# Patient Record
Sex: Male | Born: 1955
Health system: Southern US, Community
[De-identification: ages and names within clinical notes are randomized; demographics above are authoritative.]

## PROBLEM LIST (undated history)

## (undated) DIAGNOSIS — G5762 Lesion of plantar nerve, left lower limb: Secondary | ICD-10-CM

## (undated) DIAGNOSIS — Z973 Presence of spectacles and contact lenses: Secondary | ICD-10-CM

## (undated) DIAGNOSIS — N200 Calculus of kidney: Secondary | ICD-10-CM

## (undated) DIAGNOSIS — E785 Hyperlipidemia, unspecified: Secondary | ICD-10-CM

## (undated) DIAGNOSIS — I10 Essential (primary) hypertension: Secondary | ICD-10-CM

## (undated) DIAGNOSIS — K4090 Unilateral inguinal hernia, without obstruction or gangrene, not specified as recurrent: Secondary | ICD-10-CM

## (undated) DIAGNOSIS — G4730 Sleep apnea, unspecified: Secondary | ICD-10-CM

## (undated) DIAGNOSIS — K5790 Diverticulosis of intestine, part unspecified, without perforation or abscess without bleeding: Secondary | ICD-10-CM

## (undated) DIAGNOSIS — M109 Gout, unspecified: Secondary | ICD-10-CM

## (undated) HISTORY — DX: Hyperlipidemia, unspecified: E78.5

## (undated) HISTORY — PX: OTHER SURGICAL HISTORY: SHX169

## (undated) HISTORY — PX: LITHOTRIPSY: SUR834

## (undated) HISTORY — PX: VASECTOMY: SHX75

## (undated) HISTORY — PX: TONSILLECTOMY: SUR1361

## (undated) HISTORY — PX: COLONOSCOPY: SHX174

---

## 1997-09-02 ENCOUNTER — Ambulatory Visit (HOSPITAL_COMMUNITY): Admission: RE | Admit: 1997-09-02 | Discharge: 1997-09-02 | Payer: Self-pay | Admitting: Urology

## 2000-07-31 ENCOUNTER — Ambulatory Visit (HOSPITAL_COMMUNITY): Admission: RE | Admit: 2000-07-31 | Discharge: 2000-07-31 | Payer: Self-pay | Admitting: Urology

## 2000-07-31 ENCOUNTER — Encounter: Payer: Self-pay | Admitting: Urology

## 2001-10-01 ENCOUNTER — Encounter: Payer: Self-pay | Admitting: Orthopedic Surgery

## 2001-10-01 ENCOUNTER — Ambulatory Visit (HOSPITAL_COMMUNITY): Admission: RE | Admit: 2001-10-01 | Discharge: 2001-10-01 | Payer: Self-pay | Admitting: Orthopedic Surgery

## 2005-09-13 ENCOUNTER — Encounter: Admission: RE | Admit: 2005-09-13 | Discharge: 2005-09-13 | Payer: Self-pay | Admitting: Cardiology

## 2009-02-03 ENCOUNTER — Ambulatory Visit (HOSPITAL_COMMUNITY): Admission: RE | Admit: 2009-02-03 | Discharge: 2009-02-03 | Payer: Self-pay | Admitting: Family Medicine

## 2009-03-16 ENCOUNTER — Ambulatory Visit (HOSPITAL_COMMUNITY): Admission: RE | Admit: 2009-03-16 | Discharge: 2009-03-16 | Payer: Self-pay | Admitting: Urology

## 2010-06-25 ENCOUNTER — Emergency Department (HOSPITAL_COMMUNITY)
Admission: EM | Admit: 2010-06-25 | Discharge: 2010-06-25 | Disposition: A | Discharge status: Other Acute Inpatient Hospital | Payer: 59 | Source: Home / Self Care | Attending: Emergency Medicine | Admitting: Emergency Medicine

## 2010-06-25 ENCOUNTER — Emergency Department (HOSPITAL_COMMUNITY): Payer: 59

## 2010-06-25 DIAGNOSIS — I10 Essential (primary) hypertension: Secondary | ICD-10-CM | POA: Diagnosis present

## 2010-06-25 DIAGNOSIS — L989 Disorder of the skin and subcutaneous tissue, unspecified: Secondary | ICD-10-CM | POA: Insufficient documentation

## 2010-06-25 DIAGNOSIS — N12 Tubulo-interstitial nephritis, not specified as acute or chronic: Secondary | ICD-10-CM | POA: Insufficient documentation

## 2010-06-25 DIAGNOSIS — R109 Unspecified abdominal pain: Secondary | ICD-10-CM | POA: Insufficient documentation

## 2010-06-25 DIAGNOSIS — N2889 Other specified disorders of kidney and ureter: Secondary | ICD-10-CM | POA: Diagnosis present

## 2010-06-25 DIAGNOSIS — E7800 Pure hypercholesterolemia, unspecified: Secondary | ICD-10-CM | POA: Diagnosis present

## 2010-06-25 DIAGNOSIS — N201 Calculus of ureter: Secondary | ICD-10-CM | POA: Insufficient documentation

## 2010-06-25 DIAGNOSIS — R509 Fever, unspecified: Secondary | ICD-10-CM | POA: Insufficient documentation

## 2010-06-25 DIAGNOSIS — N1330 Unspecified hydronephrosis: Secondary | ICD-10-CM | POA: Insufficient documentation

## 2010-06-25 LAB — PSA: PSA: 0.26 ng/mL (ref <=4.00)

## 2010-06-25 LAB — COMPREHENSIVE METABOLIC PANEL
ALT: 25 U/L (ref 0–53)
BUN: 30 mg/dL — ABNORMAL HIGH (ref 6–23)
CO2: 24 mEq/L (ref 19–32)
Calcium: 8.8 mg/dL (ref 8.4–10.5)
GFR calc non Af Amer: 45 mL/min — ABNORMAL LOW (ref >60)
Glucose, Bld: 113 mg/dL — ABNORMAL HIGH (ref 70–99)
Total Protein: 7 g/dL (ref 6.0–8.3)

## 2010-06-25 LAB — URINE MICROSCOPIC-ADD ON

## 2010-06-25 LAB — LACTIC ACID, PLASMA: Lactic Acid, Venous: 1.8 mmol/L (ref 0.5–2.2)

## 2010-06-25 LAB — URINALYSIS, ROUTINE W REFLEX MICROSCOPIC
Bilirubin Urine: NEGATIVE
Glucose, UA: NEGATIVE mg/dL
Ketones, ur: 40 mg/dL — AB
Nitrite: POSITIVE — AB
Protein, ur: 100 mg/dL — AB
Specific Gravity, Urine: 1.024 (ref 1.005–1.030)
Urobilinogen, UA: 0.2 mg/dL (ref 0.0–1.0)
pH: 5.5 (ref 5.0–8.0)

## 2010-06-25 LAB — CBC
HCT: 40.4 % (ref 39.0–52.0)
Hemoglobin: 14.9 g/dL (ref 13.0–17.0)
MCH: 31.6 pg (ref 26.0–34.0)
MCHC: 36.9 g/dL — ABNORMAL HIGH (ref 30.0–36.0)
MCV: 85.6 fL (ref 78.0–100.0)
RDW: 13 % (ref 11.5–15.5)

## 2010-06-25 LAB — DIFFERENTIAL
Eosinophils Relative: 0 % (ref 0–5)
Lymphocytes Relative: 4 % — ABNORMAL LOW (ref 12–46)
Lymphs Abs: 0.5 10*3/uL — ABNORMAL LOW (ref 0.7–4.0)
Monocytes Absolute: 1.4 10*3/uL — ABNORMAL HIGH (ref 0.1–1.0)
Monocytes Relative: 12 % (ref 3–12)
Neutro Abs: 9.8 10*3/uL — ABNORMAL HIGH (ref 1.7–7.7)

## 2010-06-26 ENCOUNTER — Inpatient Hospital Stay (HOSPITAL_COMMUNITY)
Admission: AD | Admit: 2010-06-26 | Discharge: 2010-06-28 | DRG: 694 | Disposition: A | Discharge status: Home / Self Care | Payer: 59 | Source: Other Acute Inpatient Hospital | Attending: Urology | Admitting: Urology

## 2010-06-26 LAB — BASIC METABOLIC PANEL
BUN: 30 mg/dL — ABNORMAL HIGH (ref 6–23)
Chloride: 105 mEq/L (ref 96–112)
Creatinine, Ser: 1.56 mg/dL — ABNORMAL HIGH (ref 0.4–1.5)
GFR calc Af Amer: 57 mL/min — ABNORMAL LOW (ref >60)
GFR calc non Af Amer: 47 mL/min — ABNORMAL LOW (ref >60)
Potassium: 3.3 mEq/L — ABNORMAL LOW (ref 3.5–5.1)

## 2010-06-26 LAB — CARDIAC PANEL(CRET KIN+CKTOT+MB+TROPI)
CK, MB: 1 ng/mL (ref 0.3–4.0)
Relative Index: INVALID (ref 0.0–2.5)
Total CK: 62 U/L (ref 7–232)

## 2010-06-26 LAB — MAGNESIUM: Magnesium: 2.3 mg/dL (ref 1.5–2.5)

## 2010-06-26 LAB — DIFFERENTIAL
Basophils Absolute: 0 10*3/uL (ref 0.0–0.1)
Basophils Relative: 0 % (ref 0–1)
Lymphocytes Relative: 12 % (ref 12–46)
Neutro Abs: 6.7 10*3/uL (ref 1.7–7.7)
Neutrophils Relative %: 75 % (ref 43–77)

## 2010-06-26 LAB — CBC
HCT: 37.4 % — ABNORMAL LOW (ref 39.0–52.0)
Hemoglobin: 13.1 g/dL (ref 13.0–17.0)
MCH: 30.7 pg (ref 26.0–34.0)
MCV: 87 fL (ref 78.0–100.0)
Platelets: 161 10*3/uL (ref 150–400)
RBC: 4.6 MIL/uL (ref 4.22–5.81)
RBC: 4.29 MIL/uL (ref 4.22–5.81)
RDW: 13 % (ref 11.5–15.5)
WBC: 9.8 10*3/uL (ref 4.0–10.5)
WBC: 9 10*3/uL (ref 4.0–10.5)

## 2010-06-26 LAB — HEPATIC FUNCTION PANEL
Alkaline Phosphatase: 79 U/L (ref 39–117)
Indirect Bilirubin: 0.3 mg/dL (ref 0.3–0.9)
Total Protein: 5.8 g/dL — ABNORMAL LOW (ref 6.0–8.3)

## 2010-06-26 LAB — CORTISOL: Cortisol, Plasma: 14.4 ug/dL

## 2010-06-26 LAB — PROCALCITONIN: Procalcitonin: 3.31 ng/mL

## 2010-06-26 LAB — PHOSPHORUS: Phosphorus: 1.9 mg/dL — ABNORMAL LOW (ref 2.3–4.6)

## 2010-06-26 LAB — PROTIME-INR: INR: 1.13 (ref 0.00–1.49)

## 2010-06-27 LAB — URINALYSIS, ROUTINE W REFLEX MICROSCOPIC
Bilirubin Urine: NEGATIVE
Glucose, UA: NEGATIVE mg/dL
Ketones, ur: NEGATIVE mg/dL
pH: 6.5 (ref 5.0–8.0)

## 2010-06-27 LAB — BASIC METABOLIC PANEL
BUN: 15 mg/dL (ref 6–23)
Chloride: 106 mEq/L (ref 96–112)
Glucose, Bld: 140 mg/dL — ABNORMAL HIGH (ref 70–99)
Potassium: 3.4 mEq/L — ABNORMAL LOW (ref 3.5–5.1)

## 2010-06-27 LAB — URINE CULTURE
Colony Count: >=100000
Culture  Setup Time: 201204301742

## 2010-06-27 LAB — CBC
HCT: 33.6 % — ABNORMAL LOW (ref 39.0–52.0)
MCV: 86.2 fL (ref 78.0–100.0)
RBC: 3.9 MIL/uL — ABNORMAL LOW (ref 4.22–5.81)
RDW: 13.2 % (ref 11.5–15.5)
WBC: 9 10*3/uL (ref 4.0–10.5)

## 2010-06-27 LAB — URINE MICROSCOPIC-ADD ON

## 2010-06-28 LAB — URINE CULTURE
Colony Count: NO GROWTH
Culture  Setup Time: 201205021629

## 2010-07-01 LAB — CULTURE, BLOOD (ROUTINE X 2)
Culture  Setup Time: 201204302330
Culture: NO GROWTH

## 2010-07-09 NOTE — Op Note (Signed)
  Vincent Mendoza, Vincent Mendoza             ACCOUNT NO.:  [id number]  MEDICAL RECORD NO.:  [id number]           PATIENT TYPE:  I  LOCATION:  1233                         FACILITY:  [facility]  PHYSICIAN:  Martina Sinner, MD DATE OF BIRTH:  12-08-1955  DATE OF PROCEDURE:  06/26/2010 DATE OF DISCHARGE:                              OPERATIVE REPORT   PREOPERATIVE DIAGNOSES:  Left ureteral stone and fever.  POSTOPERATIVE DIAGNOSES:  Left ureteral stone and fever.  SURGERY:  Cystoscopy, left retrograde urethrogram, and insertion of left ureteral stent.  Vincent Mendoza has an infected stone, but unfortunately ate in the emergency room and was scheduled for 6:00 a.m.  The patient was prepped and draped in usual fashion.  A 21 scope was utilized.  Penile bulbar membranous urethra normal.  Prostatic urethra was normal.  Bladder mucosa and trigone were normal.  There is no stitch, foreign body, or carcinoma.  Under fluoroscopic guidance, I passed a sensor wire to the middle of the mid ureter and then over the wire passed a 6-French open-end ureteral catheter.  I did a gentle left retrograde urethrogram.  LEFT RETROGRADE URETHROGRAM:  I did left retrograde urethrogram only using approximately 4 to 5 mL of contrast.  A little bit of contrast reach the mildly dilated left collecting system. This marked the position more weird.  I then passed the wire up to the curling in the upper pole calix.  I removed the open-end ureteral catheter.  Under fluoroscopic and cystoscopic guidance, I then passed a 26 cm x 6-French double-J stent with no string curling in the upper pole calix and also in the bladder. I was very pleased with the position.  No hard copies were taken.  The patient was taken to recovery room.          ______________________________ Martina Sinner, MD     SAM/MEDQ  D:  06/26/2010  T:  06/26/2010  Job:  [id number]  Electronically Signed by Alfredo Martinez MD on 07/09/2010  01:16:18 PM

## 2010-07-16 ENCOUNTER — Ambulatory Visit (HOSPITAL_COMMUNITY)
Admission: RE | Admit: 2010-07-16 | Discharge: 2010-07-16 | Disposition: A | Discharge status: Home / Self Care | Payer: 59 | Source: Ambulatory Visit | Attending: Urology | Admitting: Urology

## 2010-07-16 DIAGNOSIS — N201 Calculus of ureter: Secondary | ICD-10-CM | POA: Insufficient documentation

## 2010-07-16 DIAGNOSIS — N1330 Unspecified hydronephrosis: Secondary | ICD-10-CM | POA: Insufficient documentation

## 2010-08-02 NOTE — Discharge Summary (Signed)
  NAMECALEY, VOLKERT             ACCOUNT NO.:  [id number]  MEDICAL RECORD NO.:  [id number]           PATIENT TYPE:  I  LOCATION:  1337                         FACILITY:  [facility]  PHYSICIAN:  Martina Sinner, MD DATE OF BIRTH:  [birth year]  DATE OF ADMISSION:  06/26/2010 DATE OF DISCHARGE:  06/28/2010                              DISCHARGE SUMMARY   ADMISSION DIAGNOSES: 1. Left ureteral stone. 2. Fever.  DISCHARGE DIAGNOSES: 1. Left ureteral stone. 2. Fever.  PROCEDURES: 1. Cystoscopy. 2. Left retrograde urethrogram. 3. Insertion of left ureteral stent.  HISTORY AND PHYSICAL:  For full details, please see admission history and physical.  Briefly, Mr. Pipkins is a 55 year old gentleman who was found to have a left ureteral stone and fever.  After careful consideration regarding management options for treatment, he elected to proceed with surgical therapy and a cystoscopy with left retrogradepyelography and insertion of left ureteral stent.  HOSPITAL COURSE:  On Jun 26, 2010, he was taken to the operating room where he underwent the above named procedures and which he tolerated well and without complications.  Postoperatively he was able to be transferred to regular hospital room following recovery from anesthesia. He was found to be hemodynamically stable.  He was started on prophylactic antibiotic therapy.  He was able to tolerate a regular diet and his pain was well managed.  On Jun 28, 2010, he was ready for discharge home as his pain was well managed.  He was tolerating clear liquid diet and he was afebrile.  FOLLOWUP:  He will follow up in 1 week on an outpatient basis for electroshock wave lithotripsy for definitive management of his left ureteral calculus.  MEDICATIONS:  He was given a prescription for Vicodin to take as needed for pain and told to use Colace as a stool softener.  He was also given a prescription for Cipro.  DISCHARGED INSTRUCTIONS:  He was  instructed to be ambulatory but specifically told to refrain from any heavy lifting, strenuous activity or driving.  He was instructed to resume a regular diet and to contact the office for any further development of fevers.     Delia Chimes, NP   ______________________________ Martina Sinner, MD    MA/MEDQ  D:  07/19/2010  T:  07/19/2010  Job:  [id number]  Electronically Signed by Delia Chimes NP on 07/30/2010 10:01:05 AM Electronically Signed by Alfredo Martinez MD on 08/02/2010 11:19:16 AM

## 2010-08-14 NOTE — H&P (Signed)
  NAMEFERGUS, THRONE             ACCOUNT NO.:  [id number]  MEDICAL RECORD NO.:  [id number]  LOCATION:  1337                         FACILITY:  [facility]  PHYSICIAN:  Martina Sinner, MD DATE OF BIRTH:  24-Dec-1955  DATE OF ADMISSION:  06/26/2010 DATE OF DISCHARGE:  06/28/2010                             HISTORY & PHYSICAL   DIAGNOSIS:  Obstructing stone with fever.  HISTORY OF PRESENT ILLNESS:  Mr. Willcutt was seen in the Emergency Room with an obstructing stone and fever.  He had eaten, so he was admitted to step-down for observation.  He was then treated appropriately with antibiotics.  He went on to have a stent.  He did very well postprocedure.  He was given IV Zosyn and vancomycin.  It took a few days for his fever to settle down.  His white blood count normalized to 9.8.  His electrolytes were normal. Serum creatinine normalized.  Urine culture was negative.  PAST MEDICAL HISTORY:  Negative for stones. MEDICATIONS:  See orders.  ALLERGIES:  See orders .  SOCIAL HISTORY:  Lives locally.  FAMILY HISTORY:  Negative for stone disease.  REVIEW OF SYSTEMS:  Rest of the review of systems is normal.  PHYSICAL EXAMINATION:  VITAL SIGNS:  Increased heart rate. GENERAL:  Patient looked a little bit flushed. CARDIOVASCULAR:  Skin warm. RESPIRATORY:  Breaths quiet. ABDOMEN:  No abdominal tenderness. GU EXAMINATION:  Positive left CVA tenderness. SKIN:  No rash. NEUROLOGIC:  Normal sensation to touch. MUSCULOSKELETAL:  Normal arm motor. LYMPHATIC:  No inguinal adenopathy.  ASSESSMENT AND PLAN:  Mr. Jaroszewski was admitted and subsequently underwent a stent.  He was discharged home on antibiotics and will be followed up as per protocol.          ______________________________ Martina Sinner, MD     SAM/MEDQ  D:  08/02/2010  T:  08/02/2010  Job:  [id number]  Electronically Signed by Alfredo Martinez MD on 08/14/2010 09:08:33 AM

## 2011-03-20 ENCOUNTER — Ambulatory Visit (AMBULATORY_SURGERY_CENTER): Payer: 59 | Admitting: *Deleted

## 2011-03-20 VITALS — Ht 70.0 in | Wt 189.4 lb

## 2011-03-20 DIAGNOSIS — Z1211 Encounter for screening for malignant neoplasm of colon: Secondary | ICD-10-CM

## 2011-03-20 MED ORDER — MOVIPREP 100 G PO SOLR: ORAL | Status: DC

## 2011-04-03 ENCOUNTER — Encounter: Payer: Self-pay | Admitting: Gastroenterology

## 2011-04-03 ENCOUNTER — Ambulatory Visit (AMBULATORY_SURGERY_CENTER): Payer: 59 | Admitting: Gastroenterology

## 2011-04-03 VITALS — BP 139/78 | HR 64 | Temp 98.2°F | Resp 20 | Ht 70.0 in | Wt 189.0 lb

## 2011-04-03 DIAGNOSIS — D129 Benign neoplasm of anus and anal canal: Secondary | ICD-10-CM

## 2011-04-03 DIAGNOSIS — D126 Benign neoplasm of colon, unspecified: Secondary | ICD-10-CM

## 2011-04-03 DIAGNOSIS — D128 Benign neoplasm of rectum: Secondary | ICD-10-CM

## 2011-04-03 DIAGNOSIS — Z1211 Encounter for screening for malignant neoplasm of colon: Secondary | ICD-10-CM

## 2011-04-03 DIAGNOSIS — K5730 Diverticulosis of large intestine without perforation or abscess without bleeding: Secondary | ICD-10-CM

## 2011-04-03 MED ORDER — SODIUM CHLORIDE 0.9 % IV SOLN: 500.0000 mL | INTRAVENOUS | Status: DC

## 2011-04-03 NOTE — Progress Notes (Signed)
Patient did not experience any of the following events: a burn prior to discharge; a fall within the facility; wrong site/side/patient/procedure/implant event; or a hospital transfer or hospital admission upon discharge from the facility. (G8907) Patient did not have preoperative order for IV antibiotic SSI prophylaxis. (G8918)  

## 2011-04-03 NOTE — Patient Instructions (Signed)
Please refer to your blue and neon green sheets for instructions regarding diet and activity for the rest of today.  You may resume your medications as you would normally take them.   Diverticulosis Diverticulosis is a common condition that develops when small pouches (diverticula) form in the wall of the colon. The risk of diverticulosis increases with age. It happens more often in people who eat a low-fiber diet. Most individuals with diverticulosis have no symptoms. Those individuals with symptoms usually experience abdominal pain, constipation, or loose stools (diarrhea). HOME CARE INSTRUCTIONS   Increase the amount of fiber in your diet as directed by your caregiver or dietician. This may reduce symptoms of diverticulosis.   Your caregiver may recommend taking a dietary fiber supplement.   Drink at least 6 to 8 glasses of water each day to prevent constipation.   Try not to strain when you have a bowel movement.   Your caregiver may recommend avoiding nuts and seeds to prevent complications, although this is still an uncertain benefit.   Only take over-the-counter or prescription medicines for pain, discomfort, or fever as directed by your caregiver.  FOODS WITH HIGH FIBER CONTENT INCLUDE:  Fruits. Apple, peach, pear, tangerine, raisins, prunes.   Vegetables. Brussels sprouts, asparagus, broccoli, cabbage, carrot, cauliflower, romaine lettuce, spinach, summer squash, tomato, winter squash, zucchini.   Starchy Vegetables. Baked beans, kidney beans, lima beans, split peas, lentils, potatoes (with skin).   Grains. Whole wheat bread, brown rice, bran flake cereal, plain oatmeal, white rice, shredded wheat, bran muffins.  SEEK IMMEDIATE MEDICAL CARE IF:   You develop increasing pain or severe bloating.   You have an oral temperature above 102 F (38.9 C), not controlled by medicine.   You develop vomiting or bowel movements that are bloody or black.  Document Released: 11/09/2003  Document Revised: 10/24/2010 Document Reviewed: 07/12/2009 ExitCare Patient Information 2012 [employer].  Colon Polyps A polyp is extra tissue that grows inside your body. Colon polyps grow in the large intestine. The large intestine, also called the colon, is part of your digestive system. It is a long, hollow tube at the end of your digestive tract where your body makes and stores stool. Most polyps are not dangerous. They are benign. This means they are not cancerous. But over time, some types of polyps can turn into cancer. Polyps that are smaller than a pea are usually not harmful. But larger polyps could someday become or may already be cancerous. To be safe, doctors remove all polyps and test them.  WHO GETS POLYPS? Anyone can get polyps, but certain people are more likely than others. You may have a greater chance of getting polyps if:  You are over 50.   You have had polyps before.   Someone in your family has had polyps.   Someone in your family has had cancer of the large intestine.   Find out if someone in your family has had polyps. You may also be more likely to get polyps if you:   Eat a lot of fatty foods.   Smoke.   Drink alcohol.   Do not exercise.   Eat too much.  SYMPTOMS  Most small polyps do not cause symptoms. People often do not know they have one until their caregiver finds it during a regular checkup or while testing them for something else. Some people do have symptoms like these:  Bleeding from the anus. You might notice blood on your underwear or on toilet paper after   you have had a bowel movement.   Constipation or diarrhea that lasts more than a week.   Blood in the stool. Blood can make stool look black or it can show up as red streaks in the stool.  If you have any of these symptoms, see your caregiver. HOW DOES THE DOCTOR TEST FOR POLYPS? The doctor can use four tests to check for polyps:  Digital rectal exam. The caregiver wears gloves  and checks your rectum (the last part of the large intestine) to see if it feels normal. This test would find polyps only in the rectum. Your caregiver may need to do one of the other tests listed below to find polyps higher up in the intestine.   Barium enema. The caregiver puts a liquid called barium into your rectum before taking x-rays of your large intestine. Barium makes your intestine look white in the pictures. Polyps are dark, so they are easy to see.   Sigmoidoscopy. With this test, the caregiver can see inside your large intestine. A thin flexible tube is placed into your rectum. The device is called a sigmoidoscope, which has a light and a tiny video camera in it. The caregiver uses the sigmoidoscope to look at the last third of your large intestine.   Colonoscopy. This test is like sigmoidoscopy, but the caregiver looks at all of the large intestine. It usually requires sedation. This is the most common method for finding and removing polyps.  TREATMENT   The caregiver will remove the polyp during sigmoidoscopy or colonoscopy. The polyp is then tested for cancer.   If you have had polyps, your caregiver may want you to get tested regularly in the future.  PREVENTION  There is not one sure way to prevent polyps. You might be able to lower your risk of getting them if you:  Eat more fruits and vegetables and less fatty food.   Do not smoke.   Avoid alcohol.   Exercise every day.   Lose weight if you are overweight.   Eating more calcium and folate can also lower your risk of getting polyps. Some foods that are rich in calcium are milk, cheese, and broccoli. Some foods that are rich in folate are chickpeas, kidney beans, and spinach.   Aspirin might help prevent polyps. Studies are under way.  Document Released: 11/08/2003 Document Revised: 10/24/2010 Document Reviewed: 04/15/2007 ExitCare Patient Information 2012 [employer]. 

## 2011-04-03 NOTE — Op Note (Signed)
[facility] [address] [place], [place]  [address]  COLONOSCOPY PROCEDURE REPORT  PATIENT:  Vincent, Mendoza  MR#:  [id number] BIRTHDATE:  1955-07-30, 55 yrs. old  GENDER:  male ENDOSCOPIST:  Rachael Fee, MD REF. BY:  Karleen Hampshire, M.D. PROCEDURE DATE:  04/03/2011 PROCEDURE:  Colonoscopy with snare polypectomy ASA CLASS:  Class II INDICATIONS:  Routine Risk Screening MEDICATIONS:   Fentanyl 100 mcg IV, These medications were titrated to patient response per physician's verbal order, Versed 10 mg IV DESCRIPTION OF PROCEDURE:   After the risks benefits and alternatives of the procedure were thoroughly explained, informed consent was obtained.  Digital rectal exam was performed and revealed no rectal masses.   The [id number] endoscope was introduced through the anus and advanced to the cecum, which was identified by both the appendix and ileocecal valve, without limitations.  The quality of the prep was good..  The instrument was then slowly withdrawn as the colon was fully examined. <<PROCEDUREIMAGES>> FINDINGS: Two diminutive polyps were found, both were removed with cold snare and both were sent to pathology. These were located in hepatic flexure and descending colon (see image4 and image6). Mild diverticulosis was found in the sigmoid to descending colon segments (see image1).  This was otherwise a normal examination of the colon (see image2, image3, and image7).   Retroflexed views in the rectum revealed no abnormalities. COMPLICATIONS:  None  ENDOSCOPIC IMPRESSION: 1) Two small polyps, both were removed and both sent to pathology 2) Mild diverticulosis in the sigmoid to descending colon segments 3) Otherwise normal examination  RECOMMENDATIONS: 1) If the polyp(s) removed today are proven to be adenomatous (pre-cancerous) polyps, you will need a repeat colonoscopy in 5 years. Otherwise you should continue to follow colorectal cancer screening  guidelines for "routine risk" patients with colonoscopy in 10 years. 2) You will receive a letter within 1-2 weeks with the results of your biopsy as well as final recommendations. Please call my office if you have not received a letter after 3 weeks.  ______________________________ Rachael Fee, MD  n. eSIGNED:   Rachael Fee at 04/03/2011 01:47 PM  Levander Campion, [id number]

## 2011-04-04 ENCOUNTER — Telehealth: Payer: Self-pay | Admitting: *Deleted

## 2011-04-04 NOTE — Telephone Encounter (Signed)
  Follow up Call-  Call back number 04/03/2011  Post procedure Call Back phone  # [phone]  Permission to leave phone message Yes     Patient questions:  Do you have a fever, pain , or abdominal swelling? no Pain Score  0 *  Have you tolerated food without any problems? yes  Have you been able to return to your normal activities? yes  Do you have any questions about your discharge instructions: Diet   no Medications  no Follow up visit  no  Do you have questions or concerns about your Care? no  Actions: * If pain score is 4 or above: No action needed, pain <4.

## 2011-04-09 ENCOUNTER — Encounter: Payer: Self-pay | Admitting: Gastroenterology

## 2011-10-16 ENCOUNTER — Emergency Department (HOSPITAL_COMMUNITY)
Admission: EM | Admit: 2011-10-16 | Discharge: 2011-10-16 | Disposition: A | Discharge status: Home / Self Care | Payer: 59 | Attending: Emergency Medicine | Admitting: Emergency Medicine

## 2011-10-16 ENCOUNTER — Encounter (HOSPITAL_COMMUNITY): Payer: Self-pay | Admitting: Emergency Medicine

## 2011-10-16 ENCOUNTER — Emergency Department (HOSPITAL_COMMUNITY): Payer: 59

## 2011-10-16 DIAGNOSIS — R079 Chest pain, unspecified: Secondary | ICD-10-CM | POA: Insufficient documentation

## 2011-10-16 DIAGNOSIS — E785 Hyperlipidemia, unspecified: Secondary | ICD-10-CM | POA: Insufficient documentation

## 2011-10-16 HISTORY — DX: Calculus of kidney: N20.0

## 2011-10-16 LAB — CBC
HCT: 47 % (ref 39.0–52.0)
Hemoglobin: 17.4 g/dL — ABNORMAL HIGH (ref 13.0–17.0)
MCH: 31.8 pg (ref 26.0–34.0)
MCHC: 37 g/dL — ABNORMAL HIGH (ref 30.0–36.0)
MCV: 85.8 fL (ref 78.0–100.0)
Platelets: 187 10*3/uL (ref 150–400)
RBC: 5.48 MIL/uL (ref 4.22–5.81)
RDW: 13 % (ref 11.5–15.5)
WBC: 9.6 10*3/uL (ref 4.0–10.5)

## 2011-10-16 LAB — BASIC METABOLIC PANEL
BUN: 18 mg/dL (ref 6–23)
CO2: 29 mEq/L (ref 19–32)
Calcium: 10.1 mg/dL (ref 8.4–10.5)
Chloride: 104 mEq/L (ref 96–112)
Creatinine, Ser: 1.08 mg/dL (ref 0.50–1.35)
GFR calc Af Amer: 88 mL/min — ABNORMAL LOW (ref >90)
GFR calc non Af Amer: 76 mL/min — ABNORMAL LOW (ref >90)
Glucose, Bld: 101 mg/dL — ABNORMAL HIGH (ref 70–99)
Potassium: 4.3 mEq/L (ref 3.5–5.1)
Sodium: 141 mEq/L (ref 135–145)

## 2011-10-16 LAB — POCT I-STAT TROPONIN I: Troponin i, poc: 0 ng/mL (ref 0.00–0.08)

## 2011-10-16 NOTE — ED Provider Notes (Signed)
History     CSN: [id number]  Arrival date & time 10/16/11  1859   First MD Initiated Contact with Patient 10/16/11 2039      Chief Complaint  Patient presents with  . Chest Pain    (Consider location/radiation/quality/duration/timing/severity/associated sxs/prior treatment) Patient is a 56 y.o. male presenting with chest pain. The history is provided by the patient.  Chest Pain The chest pain began 6 - 12 hours ago (8). The chest pain is unchanged. At its most intense, the pain is at 3/10. The pain is currently at 3/10. The quality of the pain is described as pressure-like and tightness. The pain does not radiate. Pertinent negatives for primary symptoms include no fever, no syncope, no shortness of breath, no cough, no palpitations, no abdominal pain, no nausea and no vomiting. Primary symptoms comment: right unilateral headache  Pertinent negatives for associated symptoms include no diaphoresis, no lower extremity edema, no near-syncope, no orthopnea and no paroxysmal nocturnal dyspnea. He tried aspirin for the symptoms.  His past medical history is significant for hyperlipidemia.  Pertinent negatives for past medical history include no CAD.     Past Medical History  Diagnosis Date  . Hyperlipidemia   . Kidney stone     Past Surgical History  Procedure Date  . Vasectomy   . Lithotripsy     x3    Family History  Problem Relation Age of Onset  . Heart disease Mother   . Heart disease Father   . Pancreatic cancer Maternal Aunt   . Heart disease Maternal Grandfather   . Heart disease Paternal Grandfather     History  Substance Use Topics  . Smoking status: Never Smoker   . Smokeless tobacco: Never Used  . Alcohol Use: 0.0 oz/week      Review of Systems  Constitutional: Negative for fever and diaphoresis.  HENT: Negative.   Eyes: Negative.   Respiratory: Positive for chest tightness. Negative for cough and shortness of breath.   Cardiovascular: Positive for  chest pain. Negative for palpitations, orthopnea, syncope and near-syncope.  Gastrointestinal: Negative for nausea, vomiting, abdominal pain, diarrhea and constipation.  Genitourinary: Negative.   Musculoskeletal: Negative.   Skin: Negative.   Neurological: Negative.   All other systems reviewed and are negative.    Allergies  Review of patient's allergies indicates no known allergies.  Home Medications   Current Outpatient Rx  Name Route Sig Dispense Refill  . CO Q 10 PO Oral Take 200 mg by mouth daily.    Marland Kitchen GLUCOSAMINE-CHONDROITIN 500-400 MG PO TABS Oral Take 1 tablet by mouth daily.    Marland Kitchen FISH OIL PO Oral Take 1,500 mg by mouth daily.    Marland Kitchen ROSUVASTATIN CALCIUM 5 MG PO TABS Oral Take 5 mg by mouth daily.      BP 158/94  Pulse 65  Temp 98.3 F (36.8 C) (Oral)  Resp 14  SpO2 98%  Physical Exam  Nursing note and vitals reviewed. Constitutional: He is oriented to person, place, and time. He appears well-developed and well-nourished. No distress.  HENT:  Head: Normocephalic and atraumatic.  Eyes: Conjunctivae are normal.  Neck: Neck supple.  Cardiovascular: Normal rate, regular rhythm, normal heart sounds and intact distal pulses.   Pulmonary/Chest: Effort normal and breath sounds normal. He has no wheezes. He has no rales.  Abdominal: Soft. He exhibits no distension. There is no tenderness.  Musculoskeletal: Normal range of motion. He exhibits no edema and no tenderness.  Neurological: He is alert and  oriented to person, place, and time.  Skin: Skin is warm and dry.    ED Course  Procedures (including critical care time)  Labs Reviewed  CBC - Abnormal; Notable for the following:    Hemoglobin 17.4 (*)     MCHC 37.0 (*)     All other components within normal limits  BASIC METABOLIC PANEL - Abnormal; Notable for the following:    Glucose, Bld 101 (*)     GFR calc non Af Amer 76 (*)     GFR calc Af Amer 88 (*)     All other components within normal limits  POCT  I-STAT TROPONIN I   Dg Chest 2 View  10/16/2011  *RADIOLOGY REPORT*  Clinical Data: Left-sided chest pain  CHEST - 2 VIEW  Comparison: 09/13/2005  Findings: Cardiomediastinal silhouette is within normal limits. The lungs are clear. No pleural effusion.  No pneumothorax.  No acute osseous abnormality.  IMPRESSION: Normal chest.   Original Report Authenticated By: Harrel Lemon, M.D.      1. Chest pain       MDM  56 yo male with PMHx of HLD who presents with 6 hour history of mild chest pressure.  No shortness of breath, nausea, diaphoresis, orthopnea, PND.  Chest pressure exacerbated by deep breathing.  No chest pain at rest.  Pt walked 3 miles this morning without chest pain.  Pt hypertensive w/o evidence of hypertensive emergency.  Physical exam as documented above.  Labs and cardiac enzymes were ordered.  EKG: NSR w/o ST or T wave changes.  No signs of ischemia or infarction.  Troponin negative.  CXR w/o evidence of acute cardiopulmonary abnormality.    Pt follows with cardiologist (Dr. Donnie Aho) due to strong family history of CAD in both parents.  Pt discussed with on call cardiology who agree he is appropriate for outpatient follow-up for further evaluation.  Tx plan discussed with pt who voiced understanding and will follow-up.  Return precautions provided.        Cherre Robins, MD 10/17/11 [phone]

## 2011-10-16 NOTE — ED Notes (Signed)
Pt c/o left sided CP into neck and left shoulder starting at 1300 with SOB; pt denies nausea

## 2011-10-16 NOTE — ED Provider Notes (Signed)
I saw and evaluated the patient, reviewed the resident's note and I agree with the findings and plan. Pt has no significant pmh.  He does not smoke.  However, strong fh of cad. Both parents cad with stents at age 67s.   Therefore, pt sees dr. Donnie Aho.  He walked 3 mi today without pain.   He was talking to someone today and got cp.  The cp increases if he inspires.  No other sxs. No pain with quiet respirations. pe normal.  ecg and trop neg.  Discussed with cards.  They to think pt can go home to f/u as outpt.    Cheri Guppy, MD 10/16/11 [phone]

## 2011-10-17 NOTE — ED Provider Notes (Signed)
I saw and evaluated the patient, reviewed the resident's note and I agree with the findings and plan.  Cheri Guppy, MD 10/17/11 [phone]

## 2012-05-15 ENCOUNTER — Other Ambulatory Visit (HOSPITAL_COMMUNITY): Payer: Self-pay | Admitting: Family Medicine

## 2012-05-15 ENCOUNTER — Ambulatory Visit (HOSPITAL_COMMUNITY)
Admission: RE | Admit: 2012-05-15 | Discharge: 2012-05-15 | Disposition: A | Discharge status: Home / Self Care | Payer: 59 | Source: Ambulatory Visit | Attending: Family Medicine | Admitting: Family Medicine

## 2012-05-15 DIAGNOSIS — R059 Cough, unspecified: Secondary | ICD-10-CM

## 2012-05-15 DIAGNOSIS — R05 Cough: Secondary | ICD-10-CM

## 2012-05-15 DIAGNOSIS — J209 Acute bronchitis, unspecified: Secondary | ICD-10-CM | POA: Insufficient documentation

## 2014-05-13 ENCOUNTER — Telehealth: Payer: Self-pay | Admitting: Gastroenterology

## 2014-05-13 NOTE — Telephone Encounter (Signed)
The pt was seen at [facility] on 05/12/14 and had CT and was told he had diverticulitis with micro perf.  He was put on cipro, flagyl and tramadol.  He was given an appt for Monday with Nevin Bloodgood to follow up.  I did consult with Barb Merino RN prior to offering Monday appt and she agreed Monday was soon enough to be seen

## 2014-05-16 ENCOUNTER — Encounter: Payer: Self-pay | Admitting: Nurse Practitioner

## 2014-05-16 ENCOUNTER — Ambulatory Visit (INDEPENDENT_AMBULATORY_CARE_PROVIDER_SITE_OTHER): Payer: 59 | Admitting: Nurse Practitioner

## 2014-05-16 VITALS — BP 140/84 | HR 60 | Ht 70.0 in | Wt 185.0 lb

## 2014-05-16 DIAGNOSIS — K5732 Diverticulitis of large intestine without perforation or abscess without bleeding: Secondary | ICD-10-CM | POA: Diagnosis not present

## 2014-05-16 MED ORDER — CIPROFLOXACIN HCL 500 MG PO TABS: ORAL_TABLET | ORAL | Status: DC

## 2014-05-16 MED ORDER — TRAMADOL HCL 50 MG PO TABS: 50.0000 mg | ORAL_TABLET | Freq: Four times a day (QID) | ORAL | Status: DC | PRN

## 2014-05-16 MED ORDER — METRONIDAZOLE 500 MG PO TABS: ORAL_TABLET | ORAL | Status: DC

## 2014-05-16 NOTE — Patient Instructions (Signed)
Your prescriptions for Cipro, Flagyl and Ultram have been phoned in to your pharmacy. Please review and follow the low residue diet. Schedule a follow up appointment for 3 to 4 weeks with Tye Savoy, PA.

## 2014-05-18 ENCOUNTER — Encounter: Payer: Self-pay | Admitting: Nurse Practitioner

## 2014-05-18 DIAGNOSIS — K5732 Diverticulitis of large intestine without perforation or abscess without bleeding: Secondary | ICD-10-CM | POA: Insufficient documentation

## 2014-05-18 NOTE — Progress Notes (Signed)
I agree with the above note, plan 

## 2014-05-18 NOTE — Progress Notes (Signed)
     History of Present Illness:  Patient is a 59 year old male known to Dr. Ardis Hughs. He had a screening colonoscoy in 2013 with findings of diverticular disease and two adenomatous polyps.   Patient has a history of kidney stones. He developed similar pain a few days ago and went to see Urology. A non-contrast CTscan was done, diverticulitis found. Patient was prescribed 7 days of cipro and flagyl. He is feeling better on antibiotics.   Current Medications, Allergies, Past Medical History, Past Surgical History, Family History and Social History were reviewed in [employer] record.  Physical Exam: General: Pleasant, well developed , white male in no acute distress Head: Normocephalic and atraumatic Eyes:  sclerae anicteric, conjunctiva pink  Ears: Normal auditory acuity Lungs: Clear throughout to auscultation Heart: Regular rate and rhythm Abdomen: Soft, non distended, mild LLQ tenderness. No masses, no hepatomegaly. Normal bowel sounds Musculoskeletal: Symmetrical with no gross deformities  Extremities: No edema  Neurological: Alert oriented x 4, grossly nonfocal Psychological:  Alert and cooperative. Normal mood and affect  Assessment and Recommendations:   59 year old male with abdominal pain and diverticulitis found on non-contrast CTscan at Urology office. He was prescribed 7 days of cipro and flagyl. Will extended this to a total of 10 days. Patient leaving for [place] next week. I will give him ultram to take as needed. Will call in extra antibiotics in case patient has a relapse after completion of the 10 days of treatment. Low fiber diet until pain resolves. Follow up with me in 2-3 weeks, or sooner if needed.

## 2014-11-17 ENCOUNTER — Other Ambulatory Visit (HOSPITAL_COMMUNITY): Payer: Self-pay | Admitting: Family Medicine

## 2014-11-17 DIAGNOSIS — R1031 Right lower quadrant pain: Secondary | ICD-10-CM

## 2014-11-21 ENCOUNTER — Ambulatory Visit (HOSPITAL_COMMUNITY)
Admission: RE | Admit: 2014-11-21 | Discharge: 2014-11-21 | Disposition: A | Discharge status: Home / Self Care | Payer: 59 | Source: Ambulatory Visit | Attending: Family Medicine | Admitting: Family Medicine

## 2014-11-21 DIAGNOSIS — N200 Calculus of kidney: Secondary | ICD-10-CM | POA: Insufficient documentation

## 2014-11-21 DIAGNOSIS — K4020 Bilateral inguinal hernia, without obstruction or gangrene, not specified as recurrent: Secondary | ICD-10-CM | POA: Diagnosis not present

## 2014-11-21 DIAGNOSIS — R918 Other nonspecific abnormal finding of lung field: Secondary | ICD-10-CM | POA: Diagnosis not present

## 2014-11-21 DIAGNOSIS — R1031 Right lower quadrant pain: Secondary | ICD-10-CM

## 2014-11-21 DIAGNOSIS — K5730 Diverticulosis of large intestine without perforation or abscess without bleeding: Secondary | ICD-10-CM | POA: Insufficient documentation

## 2014-11-21 DIAGNOSIS — N281 Cyst of kidney, acquired: Secondary | ICD-10-CM | POA: Diagnosis not present

## 2014-11-21 MED ORDER — IOHEXOL 300 MG/ML  SOLN
100.0000 mL | Freq: Once | INTRAMUSCULAR | Status: AC | PRN
  Administered 2014-11-21: 100 mL via INTRAVENOUS

## 2014-11-21 MED ORDER — SODIUM CHLORIDE 0.9 % IJ SOLN
INTRAMUSCULAR | Status: AC
  Filled 2014-11-21: qty 60

## 2014-12-08 ENCOUNTER — Other Ambulatory Visit: Payer: Self-pay | Admitting: General Surgery

## 2014-12-22 NOTE — Patient Instructions (Addendum)
KENRY DAUBERT  12/22/2014   Your procedure is scheduled on: Wednesday 12/28/2014  Report to [facility]  Entrance take [facility]  elevators to 3rd floor to  [place] at  [place]  AM.  Call this number if you have problems the morning of surgery [phone]   Remember: ONLY 1 PERSON MAY GO WITH YOU TO SHORT STAY TO GET  READY MORNING OF [place].  Do not eat food or drink liquids :After Midnight.     Take these medicines the morning of surgery with A SIP OF WATER: NONE DO NOT TAKE ANY DIABETIC MEDICATIONS DAY OF YOUR SURGERY                               You may not have any metal on your body including hair pins and              piercings  Do not wear jewelry, make-up, lotions, powders or perfumes, deodorant             Do not wear nail polish.  Do not shave  48 hours prior to surgery.              Men may shave face and neck.   Do not bring valuables to the hospital. [place].  Contacts, dentures or bridgework may not be worn into surgery.  Leave suitcase in the car. After surgery it may be brought to your room.     Patients discharged the day of surgery will not be allowed to drive home.  Name and phone number of your driver:  Special Instructions: practice deep breathing and leg exercises              Please read over the following fact sheets you were given: _____________________________________________________________________             [facility] - Preparing for Surgery Before surgery, you can play an important role.  Because skin is not sterile, your skin needs to be as free of germs as possible.  You can reduce the number of germs on your skin by washing with CHG (chlorahexidine gluconate) soap before surgery.  CHG is an antiseptic cleaner which kills germs and bonds with the skin to continue killing germs even after washing. Please DO NOT use if you have an allergy to CHG or  antibacterial soaps.  If your skin becomes reddened/irritated stop using the CHG and inform your nurse when you arrive at Short Stay. Do not shave (including legs and underarms) for at least 48 hours prior to the first CHG shower.  You may shave your face/neck. Please follow these instructions carefully:  1.  Shower with CHG Soap the night before surgery and the  morning of Surgery.  2.  If you choose to wash your hair, wash your hair first as usual with your  normal  shampoo.  3.  After you shampoo, rinse your hair and body thoroughly to remove the  shampoo.                           4.  Use CHG as you would any other liquid soap.  You can apply chg directly  to the skin and wash                       Gently with a scrungie or clean washcloth.  5.  Apply the CHG Soap to your body ONLY FROM THE NECK DOWN.   Do not use on face/ open                           Wound or open sores. Avoid contact with eyes, ears mouth and genitals (private parts).                       Wash face,  Genitals (private parts) with your normal soap.             6.  Wash thoroughly, paying special attention to the area where your surgery  will be performed.  7.  Thoroughly rinse your body with warm water from the neck down.  8.  DO NOT shower/wash with your normal soap after using and rinsing off  the CHG Soap.                9.  Pat yourself dry with a clean towel.            10.  Wear clean pajamas.            11.  Place clean sheets on your bed the night of your first shower and do not  sleep with pets. Day of Surgery : Do not apply any lotions/deodorants the morning of surgery.  Please wear clean clothes to the hospital/surgery center.  FAILURE TO FOLLOW THESE INSTRUCTIONS MAY RESULT IN THE CANCELLATION OF YOUR SURGERY PATIENT SIGNATURE_________________________________  NURSE SIGNATURE__________________________________  ________________________________________________________________________   Adam Phenix  An incentive spirometer is a tool that can help keep your lungs clear and active. This tool measures how well you are filling your lungs with each breath. Taking long deep breaths may help reverse or decrease the chance of developing breathing (pulmonary) problems (especially infection) following:  A long period of time when you are unable to move or be active. BEFORE THE PROCEDURE   If the spirometer includes an indicator to show your best effort, your nurse or respiratory therapist will set it to a desired goal.  If possible, sit up straight or lean slightly forward. Try not to slouch.  Hold the incentive spirometer in an upright position. INSTRUCTIONS FOR USE  1. Sit on the edge of your bed if possible, or sit up as far as you can in bed or on a chair. 2. Hold the incentive spirometer in an upright position. 3. Breathe out normally. 4. Place the mouthpiece in your mouth and seal your lips tightly around it. 5. Breathe in slowly and as deeply as possible, raising the piston or the ball toward the top of the column. 6. Hold your breath for 3-5 seconds or for as long as possible. Allow the piston or ball to fall to the bottom of the column. 7. Remove the mouthpiece from your mouth and breathe out normally. 8. Rest for a few seconds and repeat Steps 1 through 7 at least 10 times every 1-2 hours when you are awake. Take your time and take a few normal breaths between deep breaths. 9. The spirometer may include an indicator to show your best effort. Use the indicator as a goal to work toward during each repetition. 10. After each  set of 10 deep breaths, practice coughing to be sure your lungs are clear. If you have an incision (the cut made at the time of surgery), support your incision when coughing by placing a pillow or rolled up towels firmly against it. Once you are able to get out of bed, walk around indoors and cough well. You may stop using the incentive spirometer when  instructed by your caregiver.  RISKS AND COMPLICATIONS  Take your time so you do not get dizzy or light-headed.  If you are in pain, you may need to take or ask for pain medication before doing incentive spirometry. It is harder to take a deep breath if you are having pain. AFTER USE  Rest and breathe slowly and easily.  It can be helpful to keep track of a log of your progress. Your caregiver can provide you with a simple table to help with this. If you are using the spirometer at home, follow these instructions: [place] IF:   You are having difficultly using the spirometer.  You have trouble using the spirometer as often as instructed.  Your pain medication is not giving enough relief while using the spirometer.  You develop fever of 100.5 F (38.1 C) or higher. SEEK IMMEDIATE MEDICAL CARE IF:   You cough up bloody sputum that had not been present before.  You develop fever of 102 F (38.9 C) or greater.  You develop worsening pain at or near the incision site. MAKE SURE YOU:   Understand these instructions.  Will watch your condition.  Will get help right away if you are not doing well or get worse. Document Released: 06/24/2006 Document Revised: 05/06/2011 Document Reviewed: 08/25/2006 [facility] Patient Information 2014 [place], [place].   ________________________________________________________________________

## 2014-12-23 ENCOUNTER — Encounter (HOSPITAL_COMMUNITY)
Admission: RE | Admit: 2014-12-23 | Discharge: 2014-12-23 | Disposition: A | Discharge status: Home / Self Care | Payer: 59 | Source: Ambulatory Visit | Attending: General Surgery | Admitting: General Surgery

## 2014-12-23 ENCOUNTER — Encounter (HOSPITAL_COMMUNITY): Payer: Self-pay

## 2014-12-23 NOTE — Pre-Procedure Instructions (Addendum)
LOV Dr. Wynonia Lawman 10-21-11 on chart Stress cardiolite with resting EKG, Dr. Wynonia Lawman 10-31-11 on chart CT of A/P 11-21-14 epic  Pt was scheduled for surgery with Dr. Dalbert Batman.  However this AM, they changed his surgeon to Dr. Rosendo Gros.  They removed his OR case but have not put in a new case as of yet.  Pt was here for pre-op appointment.  Spoke with Abigail Butts at [facility].   Pt is scheduled to see Dr. Rosendo Gros Monday, Oct 31.  They are unable to schedule pt for surgery prior to having his appointment with Rosendo Gros.  Therefore, we do not have a case and account number and are unable to complete his pre-op appointment.

## 2014-12-27 NOTE — Pre-Procedure Instructions (Signed)
Vincent Mendoza  12/27/2014     Your procedure is scheduled on : Thursday December 29, 2014 at 7:15 AM.  Report to [facility] Admitting at 5:30 AM.  Call this number if you have problems the morning of surgery: [phone]    Remember:  Do not eat food or drink liquids after midnight.  Take these medicines the morning of surgery with A SIP OF WATER : Allopurinol (Zyloprim)   Stop taking any aspirin, vitamins, Fish oil, herbal medications, Ibuprofen, Advil, Motrin, Aleve, etc   Do not wear jewelry.  Do not wear lotions, powders, deodorant, or cologne.    Men may shave face and neck.  Do not bring valuables to the hospital.  [facility] is not responsible for any belongings or valuables.  Contacts, dentures or bridgework may not be worn into surgery.  Leave your suitcase in the car.  After surgery it may be brought to your room.  For patients admitted to the hospital, discharge time will be determined by your treatment team.  Patients discharged the day of surgery will not be allowed to drive home.   Name and phone number of your driver:    Special instructions:  Shower using CHG soap the night before and the morning of your surgery  Please read over the following fact sheets that you were given. Pain Booklet, Coughing and Deep Breathing and Surgical Site Infection Prevention

## 2014-12-28 ENCOUNTER — Ambulatory Visit: Payer: Self-pay | Admitting: General Surgery

## 2014-12-28 ENCOUNTER — Ambulatory Visit (HOSPITAL_COMMUNITY): Admission: RE | Admit: 2014-12-28 | Payer: 59 | Source: Ambulatory Visit | Admitting: General Surgery

## 2014-12-28 ENCOUNTER — Encounter (HOSPITAL_COMMUNITY)
Admission: RE | Admit: 2014-12-28 | Discharge: 2014-12-28 | Disposition: A | Discharge status: Home / Self Care | Payer: 59 | Source: Ambulatory Visit | Attending: General Surgery | Admitting: General Surgery

## 2014-12-28 ENCOUNTER — Encounter (HOSPITAL_COMMUNITY): Payer: Self-pay

## 2014-12-28 ENCOUNTER — Encounter (HOSPITAL_COMMUNITY): Admission: RE | Payer: Self-pay | Source: Ambulatory Visit

## 2014-12-28 DIAGNOSIS — E785 Hyperlipidemia, unspecified: Secondary | ICD-10-CM | POA: Diagnosis not present

## 2014-12-28 DIAGNOSIS — Z7982 Long term (current) use of aspirin: Secondary | ICD-10-CM | POA: Diagnosis not present

## 2014-12-28 DIAGNOSIS — I10 Essential (primary) hypertension: Secondary | ICD-10-CM | POA: Diagnosis not present

## 2014-12-28 DIAGNOSIS — K7689 Other specified diseases of liver: Secondary | ICD-10-CM | POA: Diagnosis not present

## 2014-12-28 DIAGNOSIS — K4020 Bilateral inguinal hernia, without obstruction or gangrene, not specified as recurrent: Secondary | ICD-10-CM | POA: Diagnosis not present

## 2014-12-28 DIAGNOSIS — K740 Hepatic fibrosis: Secondary | ICD-10-CM | POA: Diagnosis not present

## 2014-12-28 DIAGNOSIS — K4090 Unilateral inguinal hernia, without obstruction or gangrene, not specified as recurrent: Secondary | ICD-10-CM | POA: Diagnosis present

## 2014-12-28 HISTORY — DX: Unilateral inguinal hernia, without obstruction or gangrene, not specified as recurrent: K40.90

## 2014-12-28 HISTORY — DX: Presence of spectacles and contact lenses: Z97.3

## 2014-12-28 HISTORY — DX: Gout, unspecified: M10.9

## 2014-12-28 HISTORY — DX: Essential (primary) hypertension: I10

## 2014-12-28 HISTORY — DX: Diverticulosis of intestine, part unspecified, without perforation or abscess without bleeding: K57.90

## 2014-12-28 LAB — CBC
HEMATOCRIT: 48.7 % (ref 39.0–52.0)
Hemoglobin: 16.7 g/dL (ref 13.0–17.0)
MCH: 30.4 pg (ref 26.0–34.0)
MCHC: 34.3 g/dL (ref 30.0–36.0)
MCV: 88.7 fL (ref 78.0–100.0)
PLATELETS: 196 10*3/uL (ref 150–400)
RBC: 5.49 MIL/uL (ref 4.22–5.81)
RDW: 13.6 % (ref 11.5–15.5)
WBC: 6.7 10*3/uL (ref 4.0–10.5)

## 2014-12-28 LAB — BASIC METABOLIC PANEL
ANION GAP: 7 (ref 5–15)
BUN: 21 mg/dL — ABNORMAL HIGH (ref 6–20)
CALCIUM: 9.2 mg/dL (ref 8.9–10.3)
CHLORIDE: 107 mmol/L (ref 101–111)
CO2: 24 mmol/L (ref 22–32)
Creatinine, Ser: 1.04 mg/dL (ref 0.61–1.24)
GFR calc non Af Amer: >60 mL/min (ref >60)
GLUCOSE: 70 mg/dL (ref 65–99)
POTASSIUM: 4.1 mmol/L (ref 3.5–5.1)
Sodium: 138 mmol/L (ref 135–145)

## 2014-12-28 SURGERY — REPAIR, HERNIA, INGUINAL, ADULT
Anesthesia: General | Laterality: Right

## 2014-12-28 MED ORDER — CHLORHEXIDINE GLUCONATE 4 % EX LIQD: 1.0000 "application " | Freq: Once | CUTANEOUS | Status: DC

## 2014-12-28 MED ORDER — CEFAZOLIN SODIUM-DEXTROSE 2-3 GM-% IV SOLR
2.0000 g | INTRAVENOUS | Status: AC
  Administered 2014-12-29: 2 g via INTRAVENOUS
  Filled 2014-12-28: qty 50

## 2014-12-28 NOTE — Progress Notes (Signed)
Pt denies SOB, chest pain, and being under the care of a cardiologist. Pt stated that a stress test was completed by Dr. Wynonia Lawman, cardiology; records requested and on pt chart. Ebony Hail, [place], anesthesia, asked to review pt EKG. No new orders given.

## 2014-12-28 NOTE — Progress Notes (Signed)
Anesthesia Chart Review: Patient is a 59 year old male scheduled for laparoscopic right IHR on 12/29/14 by Dr. Rosendo Gros.   History includes non-smoker, HLD, HTN (untreated), gout, nephrolithiasis, tonsillectomy. BMI 27.   PCP is Dr. Sharilyn Sites. He was seen by cardiologist Dr. Wynonia Lawman in 2013 for evaluation for chest pain and had a normal stress test.  PAT Vitals: BP 158/92, HR 73, RR 20, 99% RA, T 36.6 C.  Meds include allopurinol, ASA, fish oil.  12/28/14 EKG: NSR, inferior infarct (age undetermined). Changes of possible inferior infarct (Q waves in leads III and aVF) were noted on EKG tracings dating back to at least 10/16/11.  10/31/11 One day stress Cardiolite: 1. Normal stress Cardiolite study with no evidence of ischemia or infarction. 2. Normal quantitative gated SPECT EF of 50% with normal wall motion and normal wall thickening.   Preoperative labs noted.   EKG has shown inferior infarct changes since 2013. Had normal stress test that same year. If no acute changes then I anticipate that he can proceed as planned.  [facility] Short Stay Center/Anesthesiology Phone [phone] 12/28/2014 12:52 PM

## 2014-12-29 ENCOUNTER — Ambulatory Visit (HOSPITAL_COMMUNITY): Payer: 59 | Admitting: Anesthesiology

## 2014-12-29 ENCOUNTER — Encounter (HOSPITAL_COMMUNITY): Payer: Self-pay | Admitting: *Deleted

## 2014-12-29 ENCOUNTER — Encounter (HOSPITAL_COMMUNITY)
Admission: RE | Disposition: A | Discharge status: Home / Self Care | Payer: Self-pay | Source: Ambulatory Visit | Attending: General Surgery

## 2014-12-29 ENCOUNTER — Ambulatory Visit (HOSPITAL_COMMUNITY)
Admission: RE | Admit: 2014-12-29 | Discharge: 2014-12-29 | Disposition: A | Discharge status: Home / Self Care | Payer: 59 | Source: Ambulatory Visit | Attending: General Surgery | Admitting: General Surgery

## 2014-12-29 ENCOUNTER — Ambulatory Visit (HOSPITAL_COMMUNITY): Payer: 59 | Admitting: Vascular Surgery

## 2014-12-29 DIAGNOSIS — K4020 Bilateral inguinal hernia, without obstruction or gangrene, not specified as recurrent: Secondary | ICD-10-CM | POA: Diagnosis not present

## 2014-12-29 DIAGNOSIS — E785 Hyperlipidemia, unspecified: Secondary | ICD-10-CM | POA: Insufficient documentation

## 2014-12-29 DIAGNOSIS — K740 Hepatic fibrosis: Secondary | ICD-10-CM | POA: Insufficient documentation

## 2014-12-29 DIAGNOSIS — I10 Essential (primary) hypertension: Secondary | ICD-10-CM | POA: Insufficient documentation

## 2014-12-29 DIAGNOSIS — K7689 Other specified diseases of liver: Secondary | ICD-10-CM | POA: Insufficient documentation

## 2014-12-29 DIAGNOSIS — Z7982 Long term (current) use of aspirin: Secondary | ICD-10-CM | POA: Insufficient documentation

## 2014-12-29 HISTORY — PX: LIVER BIOPSY: SHX301

## 2014-12-29 HISTORY — PX: LAPAROSCOPY: SHX197

## 2014-12-29 HISTORY — PX: INSERTION OF MESH: SHX5868

## 2014-12-29 HISTORY — PX: INGUINAL HERNIA REPAIR: SHX194

## 2014-12-29 SURGERY — INSERTION OF MESH
Anesthesia: General | Site: Liver | Laterality: Right

## 2014-12-29 MED ORDER — ACETAMINOPHEN 10 MG/ML IV SOLN
INTRAVENOUS | Status: AC
  Filled 2014-12-29: qty 100

## 2014-12-29 MED ORDER — ROCURONIUM BROMIDE 100 MG/10ML IV SOLN
INTRAVENOUS | Status: DC | PRN
  Administered 2014-12-29: 10 mg via INTRAVENOUS
  Administered 2014-12-29: 40 mg via INTRAVENOUS

## 2014-12-29 MED ORDER — ACETAMINOPHEN 10 MG/ML IV SOLN
INTRAVENOUS | Status: DC | PRN
  Administered 2014-12-29: 1000 mg via INTRAVENOUS

## 2014-12-29 MED ORDER — OXYCODONE HCL 5 MG PO TABS
ORAL_TABLET | ORAL | Status: AC
  Administered 2014-12-29: 10 mg via ORAL
  Filled 2014-12-29: qty 2

## 2014-12-29 MED ORDER — PROPOFOL 10 MG/ML IV BOLUS
INTRAVENOUS | Status: AC
  Filled 2014-12-29: qty 20

## 2014-12-29 MED ORDER — FENTANYL CITRATE (PF) 250 MCG/5ML IJ SOLN
INTRAMUSCULAR | Status: AC
  Filled 2014-12-29: qty 5

## 2014-12-29 MED ORDER — DEXAMETHASONE SODIUM PHOSPHATE 4 MG/ML IJ SOLN
INTRAMUSCULAR | Status: DC | PRN
  Administered 2014-12-29: 4 mg via INTRAVENOUS

## 2014-12-29 MED ORDER — BUPIVACAINE HCL (PF) 0.25 % IJ SOLN
INTRAMUSCULAR | Status: AC
  Filled 2014-12-29: qty 30

## 2014-12-29 MED ORDER — HYDROMORPHONE HCL 1 MG/ML IJ SOLN
INTRAMUSCULAR | Status: AC
  Administered 2014-12-29: 0.5 mg via INTRAVENOUS
  Filled 2014-12-29: qty 1

## 2014-12-29 MED ORDER — LACTATED RINGERS IV SOLN
INTRAVENOUS | Status: DC | PRN
  Administered 2014-12-29: 07:00:00 via INTRAVENOUS

## 2014-12-29 MED ORDER — CEFAZOLIN SODIUM-DEXTROSE 2-3 GM-% IV SOLR: 2.0000 g | INTRAVENOUS | Status: DC

## 2014-12-29 MED ORDER — SODIUM CHLORIDE 0.9 % IJ SOLN: 3.0000 mL | Freq: Two times a day (BID) | INTRAMUSCULAR | Status: DC

## 2014-12-29 MED ORDER — ACETAMINOPHEN 325 MG PO TABS: 650.0000 mg | ORAL_TABLET | ORAL | Status: DC | PRN

## 2014-12-29 MED ORDER — SODIUM CHLORIDE 0.9 % IJ SOLN: 3.0000 mL | INTRAMUSCULAR | Status: DC | PRN

## 2014-12-29 MED ORDER — PROPOFOL 10 MG/ML IV BOLUS
INTRAVENOUS | Status: DC | PRN
  Administered 2014-12-29: 30 mg via INTRAVENOUS
  Administered 2014-12-29: 200 mg via INTRAVENOUS

## 2014-12-29 MED ORDER — ACETAMINOPHEN 650 MG RE SUPP: 650.0000 mg | RECTAL | Status: DC | PRN

## 2014-12-29 MED ORDER — CHLORHEXIDINE GLUCONATE 4 % EX LIQD: 1.0000 "application " | Freq: Once | CUTANEOUS | Status: DC

## 2014-12-29 MED ORDER — MIDAZOLAM HCL 5 MG/5ML IJ SOLN
INTRAMUSCULAR | Status: DC | PRN
  Administered 2014-12-29: 2 mg via INTRAVENOUS

## 2014-12-29 MED ORDER — LIDOCAINE HCL (CARDIAC) 20 MG/ML IV SOLN
INTRAVENOUS | Status: DC | PRN
  Administered 2014-12-29 (×2): 50 mg via INTRAVENOUS

## 2014-12-29 MED ORDER — MORPHINE SULFATE (PF) 2 MG/ML IV SOLN: 2.0000 mg | INTRAVENOUS | Status: DC | PRN

## 2014-12-29 MED ORDER — 0.9 % SODIUM CHLORIDE (POUR BTL) OPTIME
TOPICAL | Status: DC | PRN
  Administered 2014-12-29: 1000 mL

## 2014-12-29 MED ORDER — ESMOLOL HCL 100 MG/10ML IV SOLN
INTRAVENOUS | Status: DC | PRN
  Administered 2014-12-29: 30 mg via INTRAVENOUS

## 2014-12-29 MED ORDER — FENTANYL CITRATE (PF) 250 MCG/5ML IJ SOLN
INTRAMUSCULAR | Status: DC | PRN
  Administered 2014-12-29: 50 ug via INTRAVENOUS
  Administered 2014-12-29: 100 ug via INTRAVENOUS
  Administered 2014-12-29 (×3): 50 ug via INTRAVENOUS
  Administered 2014-12-29: 100 ug via INTRAVENOUS

## 2014-12-29 MED ORDER — HYDROMORPHONE HCL 1 MG/ML IJ SOLN
0.2500 mg | INTRAMUSCULAR | Status: DC | PRN
  Administered 2014-12-29 (×4): 0.5 mg via INTRAVENOUS

## 2014-12-29 MED ORDER — ONDANSETRON HCL 4 MG/2ML IJ SOLN
INTRAMUSCULAR | Status: DC | PRN
  Administered 2014-12-29: 4 mg via INTRAVENOUS

## 2014-12-29 MED ORDER — PROMETHAZINE HCL 25 MG/ML IJ SOLN: 6.2500 mg | INTRAMUSCULAR | Status: DC | PRN

## 2014-12-29 MED ORDER — OXYCODONE-ACETAMINOPHEN 5-325 MG PO TABS: 1.0000 | ORAL_TABLET | ORAL | Status: DC | PRN

## 2014-12-29 MED ORDER — NEOSTIGMINE METHYLSULFATE 10 MG/10ML IV SOLN
INTRAVENOUS | Status: DC | PRN
  Administered 2014-12-29: 4 mg via INTRAVENOUS

## 2014-12-29 MED ORDER — MIDAZOLAM HCL 2 MG/2ML IJ SOLN
INTRAMUSCULAR | Status: AC
  Filled 2014-12-29: qty 4

## 2014-12-29 MED ORDER — BUPIVACAINE HCL 0.25 % IJ SOLN
INTRAMUSCULAR | Status: DC | PRN
  Administered 2014-12-29: 10 mL

## 2014-12-29 MED ORDER — GLYCOPYRROLATE 0.2 MG/ML IJ SOLN
INTRAMUSCULAR | Status: DC | PRN
  Administered 2014-12-29: 0.6 mg via INTRAVENOUS

## 2014-12-29 MED ORDER — OXYCODONE HCL 5 MG PO TABS
5.0000 mg | ORAL_TABLET | ORAL | Status: DC | PRN
  Administered 2014-12-29: 10 mg via ORAL

## 2014-12-29 MED ORDER — SODIUM CHLORIDE 0.9 % IV SOLN: 250.0000 mL | INTRAVENOUS | Status: DC | PRN

## 2014-12-29 SURGICAL SUPPLY — 59 items
APPLIER CLIP 5 13 M/L LIGAMAX5 (MISCELLANEOUS)
BENZOIN TINCTURE PRP APPL 2/3 (GAUZE/BANDAGES/DRESSINGS) ×5 IMPLANT
BLADE SURG ROTATE 9660 (MISCELLANEOUS) IMPLANT
CANISTER SUCTION 2500CC (MISCELLANEOUS) IMPLANT
CHLORAPREP W/TINT 26ML (MISCELLANEOUS) ×5 IMPLANT
CLIP APPLIE 5 13 M/L LIGAMAX5 (MISCELLANEOUS) IMPLANT
CLSR STERI-STRIP ANTIMIC 1/2X4 (GAUZE/BANDAGES/DRESSINGS) ×5 IMPLANT
COVER SURGICAL LIGHT HANDLE (MISCELLANEOUS) ×5 IMPLANT
DEVICE TROCAR PUNCTURE CLOSURE (ENDOMECHANICALS) ×5 IMPLANT
DISSECTOR BLUNT TIP ENDO 5MM (MISCELLANEOUS) IMPLANT
DRAPE LAPAROSCOPIC ABDOMINAL (DRAPES) ×5 IMPLANT
DRAPE WARM FLUID 44X44 (DRAPE) ×5 IMPLANT
DRSG TELFA 3X8 NADH (GAUZE/BANDAGES/DRESSINGS) ×5 IMPLANT
ELECT REM PT RETURN 9FT ADLT (ELECTROSURGICAL) ×5
ELECTRODE REM PT RTRN 9FT ADLT (ELECTROSURGICAL) ×4 IMPLANT
GAUZE SPONGE 2X2 8PLY STRL LF (GAUZE/BANDAGES/DRESSINGS) ×4 IMPLANT
GLOVE BIO SURGEON STRL SZ 6.5 (GLOVE) ×10 IMPLANT
GLOVE BIO SURGEON STRL SZ7 (GLOVE) ×5 IMPLANT
GLOVE BIO SURGEON STRL SZ7.5 (GLOVE) ×5 IMPLANT
GLOVE BIOGEL PI IND STRL 6.5 (GLOVE) ×8 IMPLANT
GLOVE BIOGEL PI IND STRL 7.0 (GLOVE) ×4 IMPLANT
GLOVE BIOGEL PI INDICATOR 6.5 (GLOVE) ×2
GLOVE BIOGEL PI INDICATOR 7.0 (GLOVE) ×1
GOWN STRL REUS W/ TWL LRG LVL3 (GOWN DISPOSABLE) ×8 IMPLANT
GOWN STRL REUS W/ TWL XL LVL3 (GOWN DISPOSABLE) ×4 IMPLANT
GOWN STRL REUS W/TWL LRG LVL3 (GOWN DISPOSABLE) ×2
GOWN STRL REUS W/TWL XL LVL3 (GOWN DISPOSABLE) ×1
KIT BASIN OR (CUSTOM PROCEDURE TRAY) ×5 IMPLANT
KIT ROOM TURNOVER OR (KITS) ×5 IMPLANT
LIQUID BAND (GAUZE/BANDAGES/DRESSINGS) ×5 IMPLANT
MESH 3DMAX 4X6 LT LRG (Mesh General) ×5 IMPLANT
MESH 3DMAX 4X6 RT LRG (Mesh General) ×5 IMPLANT
NEEDLE BIOPSY 14X6 SOFT TISS (NEEDLE) ×5 IMPLANT
NEEDLE INSUFFLATION 14GA 120MM (NEEDLE) ×5 IMPLANT
NS IRRIG 1000ML POUR BTL (IV SOLUTION) ×5 IMPLANT
PAD ARMBOARD 7.5X6 YLW CONV (MISCELLANEOUS) ×10 IMPLANT
RELOAD STAPLE HERNIA 4.0 BLUE (INSTRUMENTS) ×10 IMPLANT
RELOAD STAPLE HERNIA 4.8 BLK (STAPLE) IMPLANT
SCISSORS LAP 5X35 DISP (ENDOMECHANICALS) ×5 IMPLANT
SET IRRIG TUBING LAPAROSCOPIC (IRRIGATION / IRRIGATOR) IMPLANT
SET TROCAR LAP APPLE-HUNT 5MM (ENDOMECHANICALS) ×5 IMPLANT
SLEEVE ENDOPATH XCEL 5M (ENDOMECHANICALS) ×15 IMPLANT
SPONGE GAUZE 2X2 STER 10/PKG (GAUZE/BANDAGES/DRESSINGS) ×1
STAPLER HERNIA 12 8.5 360D (INSTRUMENTS) ×5 IMPLANT
STRIP CLOSURE SKIN 1/2X4 (GAUZE/BANDAGES/DRESSINGS) ×5 IMPLANT
SUT MNCRL AB 4-0 PS2 18 (SUTURE) ×5 IMPLANT
SUT VIC AB 1 CT1 27 (SUTURE)
SUT VIC AB 1 CT1 27XBRD ANBCTR (SUTURE) IMPLANT
SUT VICRYL 0 UR6 27IN ABS (SUTURE) ×5 IMPLANT
TAPE CLOTH SURG 4X10 WHT LF (GAUZE/BANDAGES/DRESSINGS) ×5 IMPLANT
TOWEL OR 17X24 6PK STRL BLUE (TOWEL DISPOSABLE) ×5 IMPLANT
TOWEL OR 17X26 10 PK STRL BLUE (TOWEL DISPOSABLE) ×5 IMPLANT
TRAY FOLEY CATH 16FR SILVER (SET/KITS/TRAYS/PACK) ×5 IMPLANT
TRAY LAPAROSCOPIC MC (CUSTOM PROCEDURE TRAY) ×5 IMPLANT
TROCAR XCEL 12X100 BLDLESS (ENDOMECHANICALS) ×5 IMPLANT
TROCAR XCEL BLUNT TIP 100MML (ENDOMECHANICALS) IMPLANT
TROCAR XCEL NON-BLD 11X100MML (ENDOMECHANICALS) IMPLANT
TROCAR XCEL NON-BLD 5MMX100MML (ENDOMECHANICALS) ×5 IMPLANT
TUBING INSUFFLATION (TUBING) ×5 IMPLANT

## 2014-12-29 NOTE — Op Note (Signed)
12/29/2014  8:59 AM  PATIENT:  [name]  59 y.o. male  PRE-OPERATIVE DIAGNOSIS:  right inguinal hernia, liver cyst  POST-OPERATIVE DIAGNOSIS:Bilateral indirect inguinal hernias, liver cysts  PROCEDURE:  Procedure(s): LAPAROSCOPY DIAGNOSTIC (N/A) LAPAROSCOPIC BILATERAL INGUINAL HERNIA REPAIR (Bilateral) CORE NEEDLE LIVER BIOPSY  SURGEON:  Surgeon(s) and Role:    * Ralene Ok, MD - Primary  ANESTHESIA:   local and general  EBL:  Total I/O In: 1500 [I.V.:1500] Out: 250 [Urine:200; Blood:50]  BLOOD ADMINISTERED:none  DRAINS: none   LOCAL MEDICATIONS USED:  BUPIVICAINE   SPECIMEN:  Source of Specimen:  LIVER BIOIPSY  DISPOSITION OF SPECIMEN:  PATHOLOGY  COUNTS:  YES  TOURNIQUET:  * No tourniquets in log *  DICTATION: .Dragon Dictation   Counts: reported as correct x 2  Findings:  The patient had a small bilateral  indirect hernias  Indications for procedure:  The patient is a 59 year old male with a Right hernia for several months. Patient complained of symptomatology to his right inguinal area.  Pt also with possible left inguinal hernia on CT as well as liver cysts. The patient was taken back for elective inguinal hernia repair and liver biopsy.  Details of the procedure: The patient was taken back to the operating room. The patient was placed in supine position with bilateral SCDs in place.  The patient underwent GETA.  A foley catheter was placed. The patient was prepped and draped in the usual sterile fashion.  After appropriate anitbiotics were confirmed, a time-out was confirmed and all facts were verified.  A veress needle technique was used to insufflate the abdomen to 69mm Hg.  a 5 mm trocar was placed in the abdomen. This was followed by a 5 mm scope. The abdomen was observed in there appeared to be some small cysts On the capsule liver. At this time the corneal biopsy was introduced into the abdomen in a specimen was taken of the liver biopsy.  Secondary to the puncture skin site there was some bleeding. A 0 Vicryl via an Endo Close was used to ligate the bleeding subcutaneous vessel. The patient was then placed in the Trendelenburg position and the inguinal areas were observed. The right inguinal hernia was easily seen. However the sigmoid colon was over the left inguinal site could not be visualized properly. At this time the pneumoperitoneum was evacuated. We proceeded with the hernia repair.  0.25% Marcaine was used to infiltrate the umbilical area. A 11-blade was used to cut down the skin and blunt dissection was used to get the anterior fashion.  The anterior fascia was incised approximately 1 cm and the muscles were retracted laterally. Blunt dissection was then used to create a space in the preperitoneal area. At this time a 10 mm camera was then introduced into the space and advanced the pubic tubercle and a 12 mm trocar was placed over this and insufflation was started.  At this time and space was created from medial to laterally the preperitoneal space.  Cooper's ligament was initially cleaned off.  The hernia sac was identified and dissected away from the cremesteric muscle fibers.  The hernia was seen in the right indirect space. Dissection of the hernia sac was undertaken the vas deferens was identified and protected in all parts of the case. The cord lipoma was also reduced appropriately.  Once the hernia sac was taken down to approximately the umbilicus a Bard 3D Max mesh, size: Large, was  introduced into the preperitoneal space.  The mesh  was brought over to cover the direct and indirect hernia spaces.  This was anchored into place and secured to Cooper's ligament with 4.72mm staples from a [place] hernia stapler. It was anchored to the anterior abdominal wall with 4.8 mm staples. The hernia sac was seen lying posterior to the mesh. There was no staples placed laterally.   The exact same dissection took place on the left side. A cord  lipoma was also reduced on the left side. The spermatic cord was identified.  The hernia sac was identified and dissected away from the cremesteric muscle fibers.  The hernia was seen in the indirect space. Dissection of the hernia sac was undertaken the vas deferens was identified and protected in all parts of the case.    The insufflation was evacuated and the peritoneum was seen posterior to the mesh bilaterally. The trochars were removed. The anterior fascia was reapproximated using #1 Vicryl on a UR- 6.  Intra-abdominal air was evacuated and the Veress needle removed. The skin was reapproximated using 4-0 Monocryl subcuticular fashion the patient was awakened from general anesthesia and taken to recovery in stable condition.    PLAN OF CARE: Admit to inpatient   PATIENT DISPOSITION:  PACU - hemodynamically stable.   Delay start of Pharmacological VTE agent (>24hrs) due to surgical blood loss or risk of bleeding: not applicable

## 2014-12-29 NOTE — Discharge Instructions (Signed)
[facility] _______[facility] ° °INGUINAL HERNIA REPAIR: POST OP INSTRUCTIONS ° °Always review your discharge instruction sheet given to you by the facility where your surgery was performed. °IF YOU HAVE DISABILITY OR FAMILY LEAVE FORMS, YOU MUST BRING THEM TO THE OFFICE FOR PROCESSING.   °DO NOT GIVE THEM TO YOUR DOCTOR. ° °1. A  prescription for pain medication may be given to you upon discharge.  Take your pain medication as prescribed, if needed.  If narcotic pain medicine is not needed, then you may take acetaminophen (Tylenol) or ibuprofen (Advil) as needed. °2. Take your usually prescribed medications unless otherwise directed. °3. If you need a refill on your pain medication, please contact your pharmacy.  They will contact our office to request authorization. Prescriptions will not be filled after 5 pm or on week-ends. °4. You should follow a light diet the first 24 hours after arrival home, such as soup and crackers, etc.  Be sure to include lots of fluids daily.  Resume your normal diet the day after surgery. °5. Most patients will experience some swelling and bruising around the umbilicus or in the groin and scrotum.  Ice packs and reclining will help.  Swelling and bruising can take several days to resolve.  °6. It is common to experience some constipation if taking pain medication after surgery.  Increasing fluid intake and taking a stool softener (such as Colace) will usually help or prevent this problem from occurring.  A mild laxative (Milk of Magnesia or Miralax) should be taken according to package directions if there are no bowel movements after 48 hours. °7. Unless discharge instructions indicate otherwise, you may remove your bandages 24-48 hours after surgery, and you may shower at that time.  You may have steri-strips (small skin tapes) in place directly over the incision.  These strips should be left on the skin for 7-10 days.  If your surgeon used skin glue on the incision, you  may shower in 24 hours.  The glue will flake off over the next 2-3 weeks.  Any sutures or staples will be removed at the office during your follow-up visit. °8. ACTIVITIES:  You may resume regular (light) daily activities beginning the next day--such as daily self-care, walking, climbing stairs--gradually increasing activities as tolerated.  You may have sexual intercourse when it is comfortable.  Refrain from any heavy lifting or straining until approved by your doctor. °a. You may drive when you are no longer taking prescription pain medication, you can comfortably wear a seatbelt, and you can safely maneuver your car and apply brakes. °b. RETURN TO WORK:  __________________________________________________________ °9. You should see your doctor in the office for a follow-up appointment approximately 2-3 weeks after your surgery.  Make sure that you call for this appointment within a day or two after you arrive home to insure a convenient appointment time. °10. OTHER INSTRUCTIONS:  __________________________________________________________________________________________________________________________________________________________________________________________  °WHEN TO CALL YOUR DOCTOR: °1. Fever over 101.0 °2. Inability to urinate °3. Nausea and/or vomiting °4. Extreme swelling or bruising °5. Continued bleeding from incision. °6. Increased pain, redness, or drainage from the incision ° °The clinic staff is available to answer your questions during regular business hours.  Please don’t hesitate to call and ask to speak to one of the nurses for clinical concerns.  If you have a medical emergency, go to the nearest emergency room or call 911.  A surgeon from [facility] is always on call at the hospital ° ° °[address], [address], [place], [place]  [address] ? ° [address] [address], [place], [place]   [address] °[phone] ? [phone] ? FAX [phone] °Web site:  [email or web address] ° °

## 2014-12-29 NOTE — Anesthesia Postprocedure Evaluation (Signed)
  Anesthesia Post-op Note  Patient: Vincent Mendoza  Procedure(s) Performed: Procedure(s): INSERTION OF MESH (Right) LAPAROSCOPY DIAGNOSTIC (N/A) LAPAROSCOPIC BILATERAL INGUINAL HERNIA REPAIR (Bilateral) LIVER BIOPSY (N/A)  Patient Location: PACU  Anesthesia Type:General  Level of Consciousness: awake  Airway and Oxygen Therapy: Patient Spontanous Breathing  Post-op Pain: mild  Post-op Assessment: Post-op Vital signs reviewed     RLE Motor Response: Responds to commands RLE Sensation: Full sensation, No numbness, No tingling      Post-op Vital Signs: Reviewed  Last Vitals:  Filed Vitals:   12/29/14 1045  BP: 172/92  Pulse: 63  Temp:   Resp: 18    Complications: No apparent anesthesia complications

## 2014-12-29 NOTE — Anesthesia Preprocedure Evaluation (Addendum)
Anesthesia Evaluation  Patient identified by MRN, date of birth, ID band Patient awake    Reviewed: Allergy & Precautions, NPO status , Patient's Chart, lab work & pertinent test results  Airway Mallampati: II  TM Distance: >3 FB     Dental  (+) Teeth Intact, Dental Advisory Given   Pulmonary neg pulmonary ROS,    breath sounds clear to auscultation       Cardiovascular hypertension,  Rhythm:Regular Rate:Normal     Neuro/Psych    GI/Hepatic negative GI ROS, Neg liver ROS,   Endo/Other    Renal/GU Renal disease     Musculoskeletal   Abdominal   Peds  Hematology   Anesthesia Other Findings   Reproductive/Obstetrics                           Anesthesia Physical Anesthesia Plan  ASA: III  Anesthesia Plan: General   Post-op Pain Management:    Induction: Intravenous  Airway Management Planned: Oral ETT  Additional Equipment:   Intra-op Plan:   Post-operative Plan: Extubation in OR  Informed Consent: I have reviewed the patients History and Physical, chart, labs and discussed the procedure including the risks, benefits and alternatives for the proposed anesthesia with the patient or authorized representative who has indicated his/her understanding and acceptance.   Dental advisory given  Plan Discussed with: Anesthesiologist and Surgeon  Anesthesia Plan Comments:        Anesthesia Quick Evaluation

## 2014-12-29 NOTE — H&P (Signed)
Vincent Mendoza is an 59 y.o. male.    History of Present Illness Ralene Ok MD; 12/26/2014 3:49 PM) Patient words: Inguinal hernia.  The patient is a 59 year old male who presents with an inguinal hernia. The patient is a 59 year old male with history of a right inguinal hernia. Patient states he notices a bulge approximately 2 months ago. Patient has some burning sensation to the right inguinal area. He recently underwent a CT scan which revealed a fat-containing right inguinal hernia as well as a possible left fat-containing hernia. He also subcentimeter cysts on the liver.  The patient is interested in laparoscopic surgery secondary to return to surgery as a patient works as a Engineer, maintenance  Past Medical History  Diagnosis Date  . Hyperlipidemia   . Kidney stone   . Gout   . Inguinal hernia     right  . Diverticulosis   . Wears glasses   . Hypertension     stopped taking Lisinopril after a week    Past Surgical History  Procedure Laterality Date  . Vasectomy    . Lithotripsy      x3  . Tonsillectomy    . Colonoscopy      Family History  Problem Relation Age of Onset  . Heart disease Mother   . Heart disease Father   . Pancreatic cancer Maternal Aunt   . Heart disease Maternal Grandfather   . Heart disease Paternal Grandfather    Social History:  reports that he has never smoked. He has never used smokeless tobacco. He reports that he drinks alcohol. He reports that he does not use illicit drugs.  Allergies:  Allergies  Allergen Reactions  . Lisinopril Cough    Medications Prior to Admission  Medication Sig Dispense Refill  . allopurinol (ZYLOPRIM) 100 MG tablet Take 100 mg by mouth daily.    Marland Kitchen aspirin EC 81 MG tablet Take 81 mg by mouth daily.    . Omega-3 Fatty Acids (FISH OIL PO) Take 1,500 mg by mouth daily.      Results for orders placed or performed during the hospital encounter of 12/28/14 (from the past 48 hour(s))  CBC     Status: None   Collection Time: 12/28/14  9:13 AM  Result Value Ref Range   WBC 6.7 4.0 - 10.5 K/uL   RBC 5.49 4.22 - 5.81 MIL/uL   Hemoglobin 16.7 13.0 - 17.0 g/dL   HCT 48.7 39.0 - 52.0 %   MCV 88.7 78.0 - 100.0 fL   MCH 30.4 26.0 - 34.0 pg   MCHC 34.3 30.0 - 36.0 g/dL   RDW 13.6 11.5 - 15.5 %   Platelets 196 150 - 400 K/uL  Basic metabolic panel     Status: Abnormal   Collection Time: 12/28/14  9:13 AM  Result Value Ref Range   Sodium 138 135 - 145 mmol/L   Potassium 4.1 3.5 - 5.1 mmol/L   Chloride 107 101 - 111 mmol/L   CO2 24 22 - 32 mmol/L   Glucose, Bld 70 65 - 99 mg/dL   BUN 21 (H) 6 - 20 mg/dL   Creatinine, Ser 1.04 0.61 - 1.24 mg/dL   Calcium 9.2 8.9 - 10.3 mg/dL   GFR calc non Af Amer >60 >60 mL/min   GFR calc Af Amer >60 >60 mL/min    Comment: (NOTE) The eGFR has been calculated using the CKD EPI equation. This calculation has not been validated in all clinical situations. eGFR's persistently <  60 mL/min signify possible Chronic Kidney Disease.    Anion gap 7 5 - 15   No results found.  Review of Systems  Constitutional: Negative for weight loss.  HENT: Negative for ear discharge, ear pain, hearing loss and tinnitus.   Eyes: Negative for blurred vision, double vision, photophobia and pain.  Respiratory: Negative for cough, sputum production and shortness of breath.   Cardiovascular: Negative for chest pain.  Gastrointestinal: Negative for nausea, vomiting and abdominal pain.  Genitourinary: Negative for dysuria, urgency, frequency and flank pain.  Musculoskeletal: Negative for myalgias, back pain, joint pain, falls and neck pain.  Neurological: Negative for dizziness, tingling, sensory change, focal weakness, loss of consciousness and headaches.  Endo/Heme/Allergies: Does not bruise/bleed easily.  Psychiatric/Behavioral: Negative for depression, memory loss and substance abuse. The patient is not nervous/anxious.     Blood pressure 200/107, pulse 66, temperature 97.7 F  (36.5 C), temperature source Oral, resp. rate 16, height _0  (1.778 m), weight 85.639 kg (188 lb 12.8 oz), SpO2 99 %. Physical Exam  Constitutional: He is oriented to person, place, and time. He appears well-developed and well-nourished.  HENT:  Head: Normocephalic and atraumatic.  Eyes: Conjunctivae and EOM are normal. Pupils are equal, round, and reactive to light.  Neck: Normal range of motion.  Respiratory: Effort normal.  GI: Soft. Bowel sounds are normal. A hernia is present. Hernia confirmed positive in the right inguinal area.  Musculoskeletal: Normal range of motion.  Neurological: He is alert and oriented to person, place, and time.     Assessment/Plan RIGHT INGUINAL HERNIA (K40.90) Impression: 59 year old male with a reducible right inguinal hernia  1. The patient will like to proceed to the operating room for laparoscopic right inguinal hernia repair with mesh, and diagnostic laparoscopy.  2. I discussed with the patient the signs and symptoms of incarceration and strangulation and the need to proceed to the ER should they occur.  3. I discussed with the patient the risks and benefits of the procedure to include but not limited to: Infection, bleeding, damage to surrounding structures, possible need for further surgery, possible nerve pain, and possible recurrence. The patient was understanding and wishes to proceed.  Rosario Jacks., Kasyn Stouffer 12/29/2014, 7:19 AM

## 2014-12-29 NOTE — Transfer of Care (Signed)
Immediate Anesthesia Transfer of Care Note  Patient: Vincent Mendoza  Procedure(s) Performed: Procedure(s): INSERTION OF MESH (Right) LAPAROSCOPY DIAGNOSTIC (N/A) LAPAROSCOPIC BILATERAL INGUINAL HERNIA REPAIR (Bilateral) LIVER BIOPSY (N/A)  Patient Location: PACU  Anesthesia Type:General  Level of Consciousness: awake  Airway & Oxygen Therapy: Patient Spontanous Breathing and Patient connected to face mask oxygen  Post-op Assessment: Report given to RN and Post -op Vital signs reviewed and stable  Post vital signs: Reviewed and stable  Last Vitals:  Filed Vitals:   12/29/14 0548  BP: 200/107  Pulse: 66  Temp: 36.5 C  Resp: 16    Complications: No apparent anesthesia complications

## 2014-12-29 NOTE — Anesthesia Procedure Notes (Signed)
Procedure Name: Intubation Date/Time: 12/29/2014 7:45 AM Performed by: Maude Leriche D Pre-anesthesia Checklist: Patient identified, Emergency Drugs available, Suction available, Patient being monitored and Timeout performed Patient Re-evaluated:Patient Re-evaluated prior to inductionOxygen Delivery Method: Circle system utilized Preoxygenation: Pre-oxygenation with 100% oxygen Intubation Type: IV induction Ventilation: Mask ventilation without difficulty Laryngoscope Size: Miller, 2 and Glidescope (first attempt with miller 2- second attempt with Glidescope) Grade View: Grade I Tube type: Oral Tube size: 7.5 mm Number of attempts: 2 (First attempt with Sabra Heck 2- only base of arytenoids seen. Dl with Glidescope and ETT easily placed. ) Airway Equipment and Method: Stylet Placement Confirmation: ETT inserted through vocal cords under direct vision,  positive ETCO2 and breath sounds checked- equal and bilateral Secured at: 22 cm Tube secured with: Tape Dental Injury: Teeth and Oropharynx as per pre-operative assessment

## 2014-12-30 ENCOUNTER — Encounter (HOSPITAL_COMMUNITY): Payer: Self-pay | Admitting: General Surgery

## 2015-04-14 ENCOUNTER — Encounter: Payer: Self-pay | Admitting: Orthopedic Surgery

## 2015-04-14 ENCOUNTER — Ambulatory Visit (INDEPENDENT_AMBULATORY_CARE_PROVIDER_SITE_OTHER): Payer: 59 | Admitting: Orthopedic Surgery

## 2015-04-14 VITALS — BP 179/103 | Ht 70.0 in | Wt 189.0 lb

## 2015-04-14 DIAGNOSIS — M67972 Unspecified disorder of synovium and tendon, left ankle and foot: Secondary | ICD-10-CM

## 2015-04-14 NOTE — Patient Instructions (Signed)
CONTINUE IBUPROFEN, ICE, AND REST

## 2015-04-14 NOTE — Progress Notes (Signed)
Patient ID: Vincent Mendoza, male   DOB: 18-Aug-1955, 60 y.o.   MRN: [id number]  Chief Complaint  Patient presents with  . Foot Pain    LEFT HEEL PAIN, NO KNOWN INJURY    HPI Vincent Mendoza is a 60 y.o. male.  Presents for evaluation of left heel pain.  Acute onset 2 days ago no trauma. Patient did walk on the treadmill Monday and then a couple days later started having pain swelling posterior aspect of his Achilles associated with throbbing constant 7 out of 10 pain. Previous treatment ibuprofen with rest. He did have a day or so where he couldn't put any weight on his foot all seems to be getting better. No previous Achilles issues  Review of systems tinnitus sinusitis and pain otherwise normal  Review of Systems Review of Systems  All other systems reviewed and are negative.   Past Medical History  Diagnosis Date  . Hyperlipidemia   . Kidney stone   . Gout   . Inguinal hernia     right  . Diverticulosis   . Wears glasses   . Hypertension     stopped taking Lisinopril after a week    Past Surgical History  Procedure Laterality Date  . Vasectomy    . Lithotripsy      x3  . Tonsillectomy    . Colonoscopy    . Insertion of mesh Right 12/29/2014    Procedure: INSERTION OF MESH;  Surgeon: Ralene Ok, MD;  Location: [place];  Service: General;  Laterality: Right;  . Laparoscopy N/A 12/29/2014    Procedure: LAPAROSCOPY DIAGNOSTIC;  Surgeon: Ralene Ok, MD;  Location: [place];  Service: General;  Laterality: N/A;  . Inguinal hernia repair Bilateral 12/29/2014    Procedure: LAPAROSCOPIC BILATERAL INGUINAL HERNIA REPAIR;  Surgeon: Ralene Ok, MD;  Location: [place];  Service: General;  Laterality: Bilateral;  . Liver biopsy N/A 12/29/2014    Procedure: LIVER BIOPSY;  Surgeon: Ralene Ok, MD;  Location: [facility] OR;  Service: General;  Laterality: N/A;    Family History  Problem Relation Age of Onset  . Heart disease Mother   . Heart disease Father   . Pancreatic  cancer Maternal Aunt   . Heart disease Maternal Grandfather   . Heart disease Paternal Grandfather     Social History Social History  Substance Use Topics  . Smoking status: Never Smoker   . Smokeless tobacco: Never Used  . Alcohol Use: 0.0 oz/week     Comment: occassional     Allergies  Allergen Reactions  . Lisinopril Cough    Current Outpatient Prescriptions  Medication Sig Dispense Refill  . potassium citrate (UROCIT-K) 10 MEQ (1080 MG) SR tablet     . allopurinol (ZYLOPRIM) 100 MG tablet Take 100 mg by mouth daily.    Marland Kitchen aspirin EC 81 MG tablet Take 81 mg by mouth daily.    . Omega-3 Fatty Acids (FISH OIL PO) Take 1,500 mg by mouth daily.     No current facility-administered medications for this visit.       Physical Exam Physical Exam Blood pressure 179/103, height 5\' 10"  (1.778 m), weight 189 lb (85.73 kg). Appearance, there are no abnormalities in terms of appearance the patient was well-developed and well-nourished. The grooming and hygiene were normal.  Mental status orientation, there was normal alertness and orientation Mood pleasant Ambulatory status normal with no assistive devices  Examination of the left heel inspection reveals tenderness in nodularity swelling Achilles insertion  the Achilles tendon itself is nontender there is no synovitis range of motion remains normal ankle is stable muscle tone normal no tremor area opposite tendon normal contour notendernessnormalrangeofmotionoftheankle Skin warm dry and intact without laceration or ulceration or erythema Neurologic examination normal sensation Vascular examination normal pulses with warm extremity and normal capillary refill   Data Reviewed He brought his own x-ray he has calcific tendinitis and is also a plantar spur in the heel  Assessment  Calcific tendinitis insertional left Achilles   Plan  Rest, ice, ibuprofen  Come back if any problems

## 2016-01-11 IMAGING — CT CT ABD-PELV W/ CM
2 of 5 series · 16 of 46 positions shown, 18 images · IV contrast (Omnipaque 300)
Comparison: 05/12/2014 and 06/25/2010

CLINICAL DATA: Right lower quadrant pain worse over the past month.
Patient states diagnosed with diverticulitis 2-3 months ago with
micro perforation.

EXAM:
CT ABDOMEN AND PELVIS WITH CONTRAST
TECHNIQUE: Multidetector CT imaging of the abdomen and pelvis was performed
using the standard protocol following bolus administration of
intravenous contrast.
CONTRAST:  100mL OMNIPAQUE IOHEXOL 300 MG/ML  SOLN

[Series 2: abd_pel_with 5.0 b40f · axial · 0.73mm/px · z∈[-447,-27]mm · 13 of 96 slices shown, 15 images]
[im 6/96  soft-tissue]
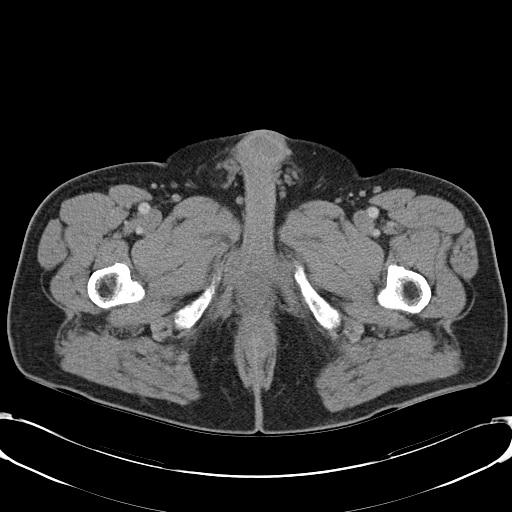
[im 6/96  bone]
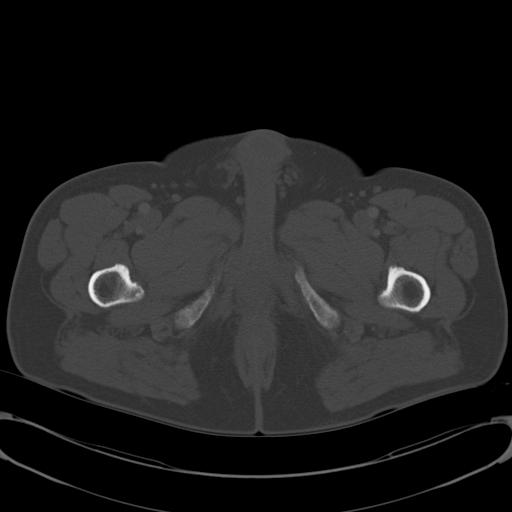
[im 12/96  soft-tissue]
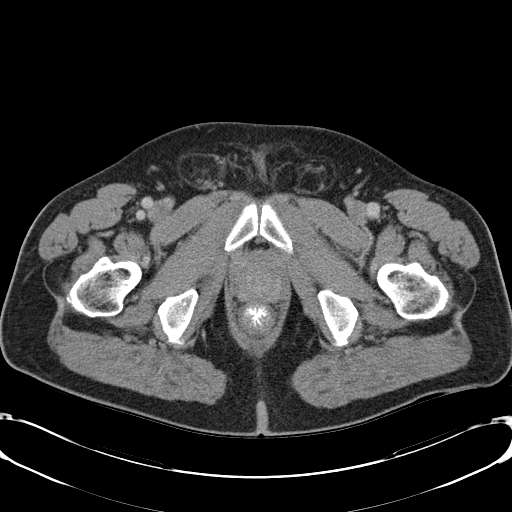
[im 23/96  soft-tissue]
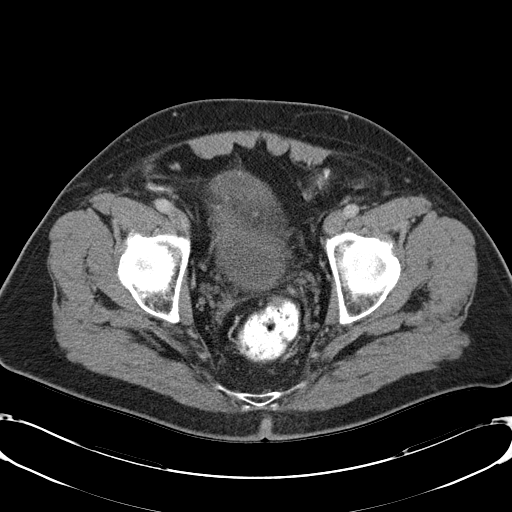
[im 28/96  soft-tissue]
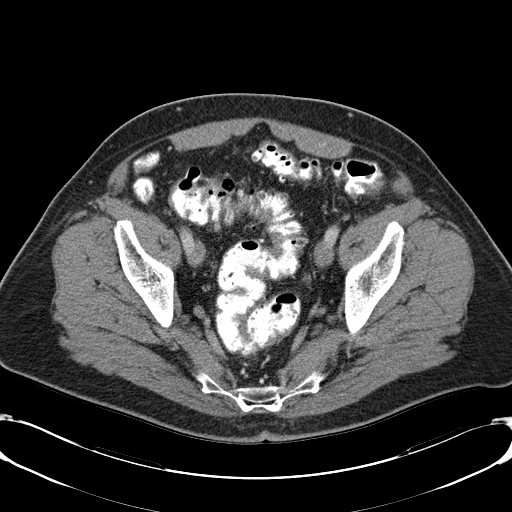
[im 34/96  soft-tissue]
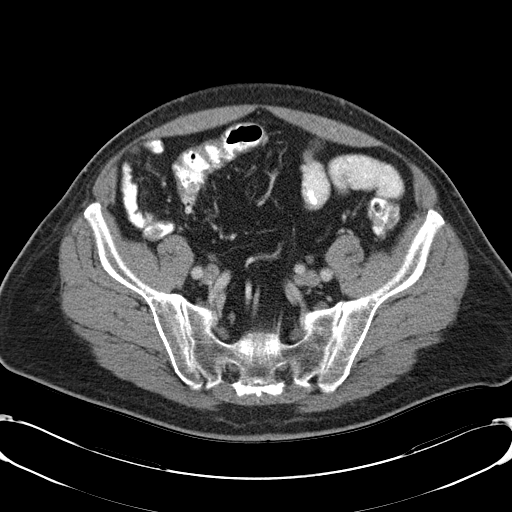
[im 40/96  soft-tissue]
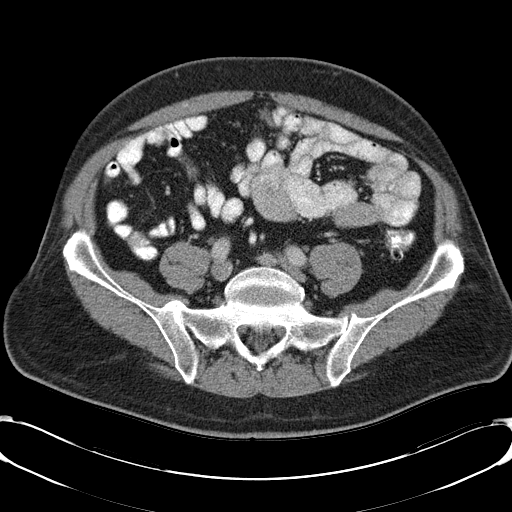
[im 51/96  soft-tissue]
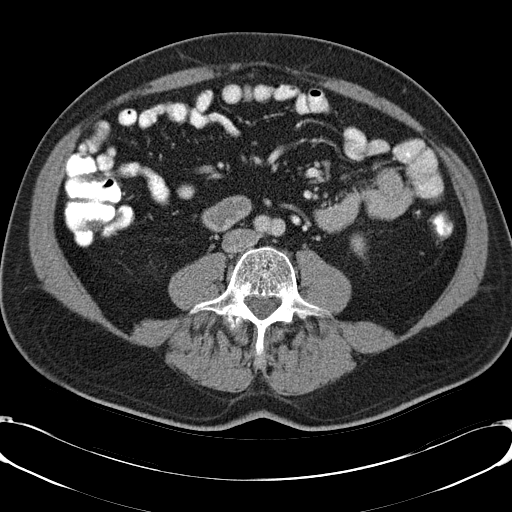
[im 56/96  soft-tissue]
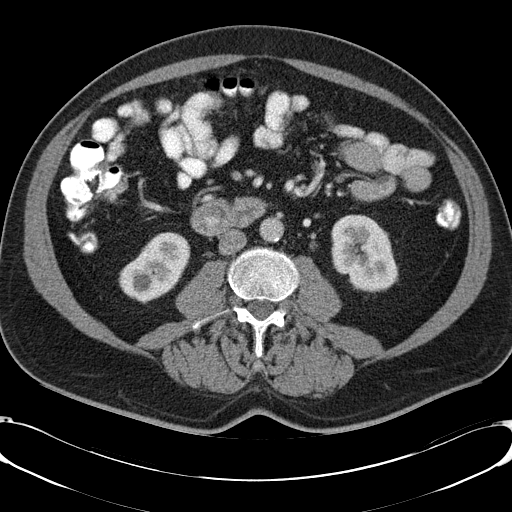
[im 62/96  soft-tissue]
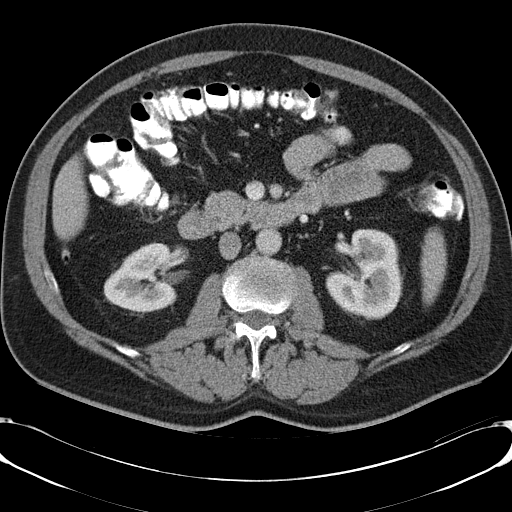
[im 62/96  bone]
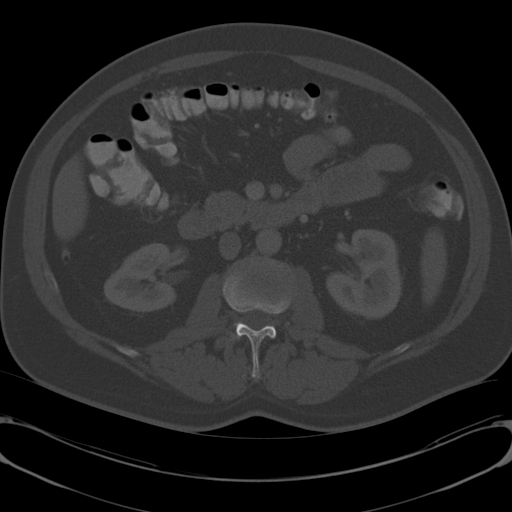
[im 68/96  soft-tissue]
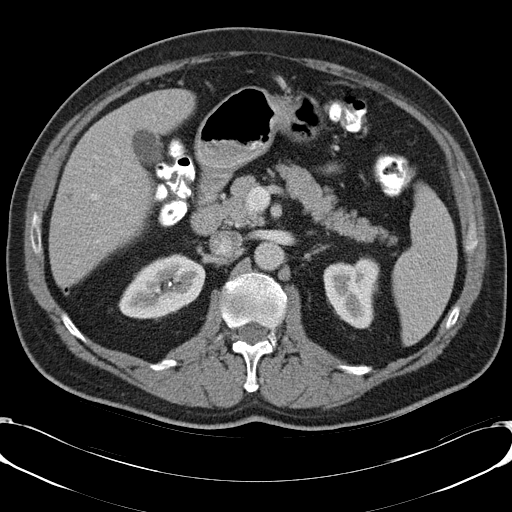
[im 73/96  soft-tissue]
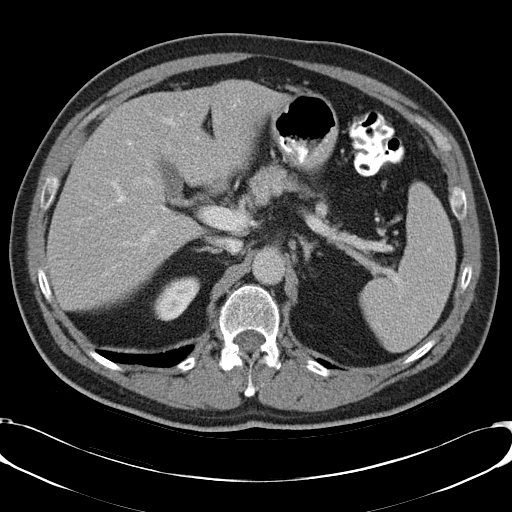
[im 84/96  soft-tissue]
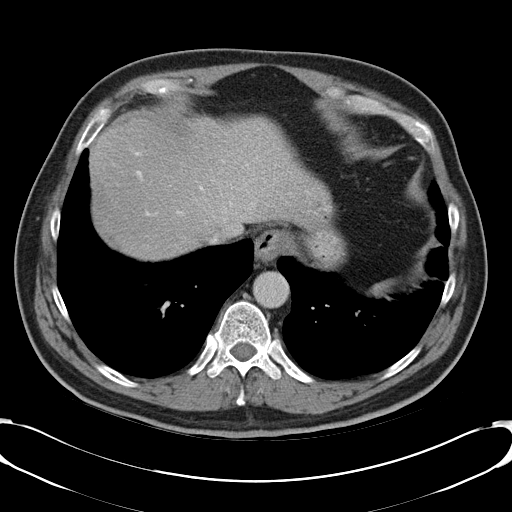
[im 90/96  soft-tissue]
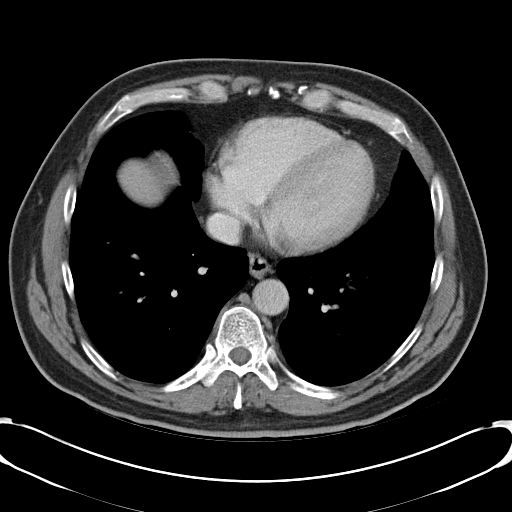

[Series 4: abd_pel_with 3.0 spo · coronal · 0.79mm/px · 3 of 96 slices shown]
[im 32/96  soft-tissue]
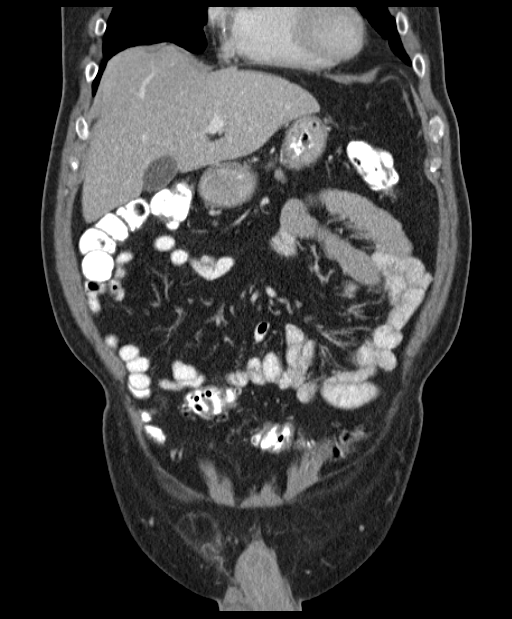
[im 43/96  soft-tissue]
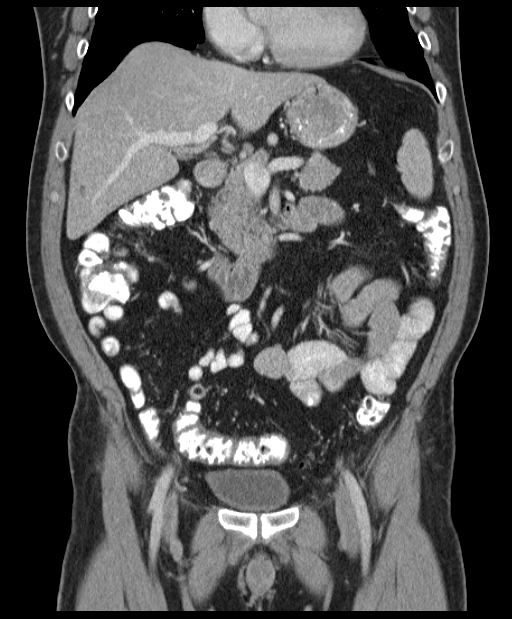
[im 53/96  soft-tissue]
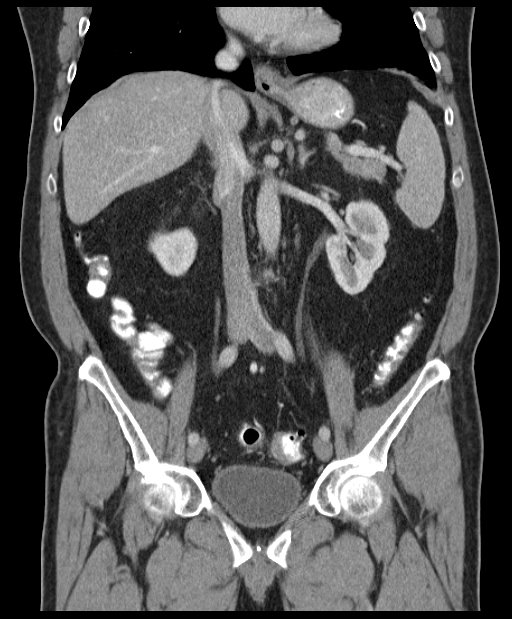

[16 of 46 positions shown; findings below may reference images not displayed]

FINDINGS: Lung bases demonstrate a 2 mm peripheral nodule over the lateral
left lower lobe. Stable 3-4 mm nodule over the lingula.

Abdominal images demonstrate a few small sub cm liver hypodensities
too small to characterize but likely cysts. The spleen, pancreas,
gallbladder and adrenal glands are normal. Appendix is normal.

Kidneys normal in size with a couple small nonobstructing right
renal stones unchanged. No evidence of hydronephrosis. 1.1 cm cyst
over the lower pole of the right kidney unchanged. Ureters are
normal.

There is minimal calcified plaque over the abdominal aorta.
Mesentery is within normal.

There is moderate diverticulosis throughout the colon most prominent
over the distal descending and sigmoid colon. There is no acute
inflammatory/infectious process.

Pelvic images demonstrate the bladder, prostate and rectum to be
within normal. Small bilateral inguinal hernias containing only
peritoneal fat.

Minimal degenerate change of the spine and hips.
IMPRESSION: Moderate diverticulosis of the colon without acute inflammation.

Mild right-sided nephrolithiasis without evidence of obstruction.
1.1 cm right renal cyst unchanged.

Few subcentimeter liver hypodensities too small to characterize but
likely cysts.

2 mm nodule over the left lower lobe likely benign. Recommend
followup chest CT 1 year. This recommendation follows the consensus
statement: Guidelines for Management of Small Pulmonary Nodules
Detected on CT Scans: A Statement from the [HOSPITAL] as
[URL]

Small bilateral inguinal hernias containing only peritoneal fat.

## 2016-02-26 HISTORY — PX: COLONOSCOPY: SHX174

## 2016-03-11 ENCOUNTER — Encounter: Payer: Self-pay | Admitting: Gastroenterology

## 2016-03-27 ENCOUNTER — Encounter: Payer: Self-pay | Admitting: Gastroenterology

## 2016-05-08 ENCOUNTER — Ambulatory Visit (AMBULATORY_SURGERY_CENTER): Payer: Self-pay

## 2016-05-08 VITALS — Ht 69.5 in | Wt 192.8 lb

## 2016-05-08 DIAGNOSIS — Z860100 Personal history of colon polyps, unspecified: Secondary | ICD-10-CM

## 2016-05-08 MED ORDER — SUPREP BOWEL PREP KIT 17.5-3.13-1.6 GM/177ML PO SOLN: 1.0000 | Freq: Once | ORAL | 0 refills | Status: AC

## 2016-05-08 NOTE — Progress Notes (Signed)
No allergies to eggs or soy No diet meds No home oxygen No past problems with anesthesia  Declined emmi 

## 2016-05-10 ENCOUNTER — Encounter: Payer: Self-pay | Admitting: Gastroenterology

## 2016-05-22 ENCOUNTER — Ambulatory Visit (AMBULATORY_SURGERY_CENTER): Payer: 59 | Admitting: Gastroenterology

## 2016-05-22 ENCOUNTER — Encounter: Payer: Self-pay | Admitting: Gastroenterology

## 2016-05-22 VITALS — BP 110/79 | HR 57 | Temp 98.4°F | Resp 20 | Ht 69.5 in | Wt 192.0 lb

## 2016-05-22 DIAGNOSIS — D128 Benign neoplasm of rectum: Secondary | ICD-10-CM

## 2016-05-22 DIAGNOSIS — Z86010 Personal history of colonic polyps: Secondary | ICD-10-CM

## 2016-05-22 DIAGNOSIS — D124 Benign neoplasm of descending colon: Secondary | ICD-10-CM

## 2016-05-22 DIAGNOSIS — K5730 Diverticulosis of large intestine without perforation or abscess without bleeding: Secondary | ICD-10-CM

## 2016-05-22 DIAGNOSIS — D122 Benign neoplasm of ascending colon: Secondary | ICD-10-CM | POA: Diagnosis not present

## 2016-05-22 DIAGNOSIS — D129 Benign neoplasm of anus and anal canal: Secondary | ICD-10-CM

## 2016-05-22 MED ORDER — SODIUM CHLORIDE 0.9 % IV SOLN: 500.0000 mL | INTRAVENOUS | Status: DC

## 2016-05-22 NOTE — Progress Notes (Signed)
Called to room to assist during endoscopic procedure.  Patient ID and intended procedure confirmed with present staff. Received instructions for my participation in the procedure from the performing physician.  

## 2016-05-22 NOTE — Op Note (Signed)
[facility] Patient Name: Vincent Mendoza Procedure Date: 05/22/2016 8:44 AM MRN: [id number] Endoscopist: Milus Banister , MD Age: 61 Referring MD:  Date of Birth: 1955/12/28 Gender: Male Account #: [id number] Procedure:                Colonoscopy Indications:              High risk colon cancer surveillance: Personal                            history of colonic polyps: 2 subCM adenomas removed                            2013 colonoscopy Dr. Ardis Hughs Medicines:                Monitored Anesthesia Care Procedure:                Pre-Anesthesia Assessment:                           - Prior to the procedure, a History and Physical                            was performed, and patient medications and                            allergies were reviewed. The patient's tolerance of                            previous anesthesia was also reviewed. The risks                            and benefits of the procedure and the sedation                            options and risks were discussed with the patient.                            All questions were answered, and informed consent                            was obtained. Prior Anticoagulants: The patient has                            taken no previous anticoagulant or antiplatelet                            agents. ASA Grade Assessment: II - A patient with                            mild systemic disease. After reviewing the risks                            and benefits, the patient was deemed in  satisfactory condition to undergo the procedure.                           After obtaining informed consent, the colonoscope                            was passed under direct vision. Throughout the                            procedure, the patient's blood pressure, pulse, and                            oxygen saturations were monitored continuously. The                            Colonoscope was introduced through  the anus and                            advanced to the the cecum, identified by                            appendiceal orifice and ileocecal valve. The                            colonoscopy was performed without difficulty. The                            patient tolerated the procedure well. The quality                            of the bowel preparation was excellent. The                            ileocecal valve, appendiceal orifice, and rectum                            were photographed. Scope In: 8:53:17 AM Scope Out: 9:07:32 AM Total Procedure Duration: 0 hours 14 minutes 15 seconds  Findings:                 Three sessile polyps were found in the rectum,                            descending colon and ascending colon. The polyps                            were 3 to 4 mm in size. These polyps were removed                            with a cold snare. Resection was complete, but the                            polyp tissue was only partially retrieved.  Multiple small and large-mouthed diverticula were                            found in the left colon.                           The exam was otherwise without abnormality on                            direct and retroflexion views. Complications:            No immediate complications. Estimated blood loss:                            None. Estimated Blood Loss:     Estimated blood loss: none. Impression:               - Three 3 to 4 mm polyps in the rectum, in the                            descending colon and in the ascending colon,                            removed with a cold snare. Complete resection.                            Partial retrieval.                           - Diverticulosis in the left colon.                           - The examination was otherwise normal on direct                            and retroflexion views. Recommendation:           - Patient has a contact number available for                             emergencies. The signs and symptoms of potential                            delayed complications were discussed with the                            patient. Return to normal activities tomorrow.                            Written discharge instructions were provided to the                            patient.                           - Resume previous diet.                           -  Continue present medications.                           You will receive a letter within 2-3 weeks with the                            pathology results and my final recommendations.                           If the polyp(s) is proven to be 'pre-cancerous' on                            pathology, you will need repeat colonoscopy in 3                            years. If the polyp(s) is NOT 'precancerous' on                            pathology then you should repeat colon cancer                            screening in 10 years with colonoscopy without need                            for colon cancer screening by any method prior to                            then (including stool testing). Milus Banister, MD 05/22/2016 9:10:30 AM This report has been signed electronically.

## 2016-05-22 NOTE — Patient Instructions (Signed)
YOU HAD AN ENDOSCOPIC PROCEDURE TODAY AT THE [facility]:   Refer to the procedure report that was given to you for any specific questions about what was found during the examination.  If the procedure report does not answer your questions, please call your gastroenterologist to clarify.  If you requested that your care partner not be given the details of your procedure findings, then the procedure report has been included in a sealed envelope for you to review at your convenience later.  YOU SHOULD EXPECT: Some feelings of bloating in the abdomen. Passage of more gas than usual.  Walking can help get rid of the air that was put into your GI tract during the procedure and reduce the bloating. If you had a lower endoscopy (such as a colonoscopy or flexible sigmoidoscopy) you may notice spotting of blood in your stool or on the toilet paper. If you underwent a bowel prep for your procedure, you may not have a normal bowel movement for a few days.  Please Note:  You might notice some irritation and congestion in your nose or some drainage.  This is from the oxygen used during your procedure.  There is no need for concern and it should clear up in a day or so.  SYMPTOMS TO REPORT IMMEDIATELY:   Following lower endoscopy (colonoscopy or flexible sigmoidoscopy):  Excessive amounts of blood in the stool  Significant tenderness or worsening of abdominal pains  Swelling of the abdomen that is new, acute  Fever of 100F or higher   For urgent or emergent issues, a gastroenterologist can be reached at any hour by calling [phone]. Please read all handouts given to you by your recovery nurse.   DIET:  We do recommend a small meal at first, but then you may proceed to your regular diet.  Drink plenty of fluids but you should avoid alcoholic beverages for 24 hours.  ACTIVITY:  You should plan to take it easy for the rest of today and you should NOT DRIVE or use heavy machinery until  tomorrow (because of the sedation medicines used during the test).    FOLLOW UP: Our staff will call the number listed on your records the next business day following your procedure to check on you and address any questions or concerns that you may have regarding the information given to you following your procedure. If we do not reach you, we will leave a message.  However, if you are feeling well and you are not experiencing any problems, there is no need to return our call.  We will assume that you have returned to your regular daily activities without incident.  If any biopsies were taken you will be contacted by phone or by letter within the next 1-3 weeks.  Please call us at [phone] if you have not heard about the biopsies in 3 weeks.    SIGNATURES/CONFIDENTIALITY: You and/or your care partner have signed paperwork which will be entered into your electronic medical record.  These signatures attest to the fact that that the information above on your After Visit Summary has been reviewed and is understood.  Full responsibility of the confidentiality of this discharge information lies with you and/or your care-partner.  Thank you for letting us take care of your healthcare needs today. 

## 2016-05-22 NOTE — Progress Notes (Signed)
To recovery, report to Monday, RN, VSS 

## 2016-05-23 ENCOUNTER — Telehealth: Payer: Self-pay | Admitting: *Deleted

## 2016-05-23 NOTE — Telephone Encounter (Signed)
  Follow up Call-  Call back number 05/22/2016  Post procedure Call Back phone  # [phone]  Permission to leave phone message Yes  Some recent data might be hidden    [facility]

## 2016-05-23 NOTE — Telephone Encounter (Signed)
  Follow up Call-  Call back number 05/22/2016  Post procedure Call Back phone  # [phone]  Permission to leave phone message Yes  Some recent data might be hidden    [facility]

## 2016-05-31 ENCOUNTER — Encounter: Payer: Self-pay | Admitting: Gastroenterology

## 2017-02-10 ENCOUNTER — Other Ambulatory Visit: Payer: Self-pay | Admitting: Orthopedic Surgery

## 2017-03-14 ENCOUNTER — Other Ambulatory Visit: Payer: Self-pay

## 2017-03-14 ENCOUNTER — Encounter (HOSPITAL_BASED_OUTPATIENT_CLINIC_OR_DEPARTMENT_OTHER): Payer: Self-pay | Admitting: *Deleted

## 2017-03-20 ENCOUNTER — Encounter (HOSPITAL_BASED_OUTPATIENT_CLINIC_OR_DEPARTMENT_OTHER)
Admission: RE | Disposition: A | Discharge status: Home / Self Care | Payer: Self-pay | Source: Ambulatory Visit | Attending: Orthopedic Surgery

## 2017-03-20 ENCOUNTER — Encounter (HOSPITAL_BASED_OUTPATIENT_CLINIC_OR_DEPARTMENT_OTHER): Payer: Self-pay | Admitting: *Deleted

## 2017-03-20 ENCOUNTER — Ambulatory Visit (HOSPITAL_BASED_OUTPATIENT_CLINIC_OR_DEPARTMENT_OTHER)
Admission: RE | Admit: 2017-03-20 | Discharge: 2017-03-20 | Disposition: A | Discharge status: Home / Self Care | Payer: 59 | Source: Ambulatory Visit | Attending: Orthopedic Surgery | Admitting: Orthopedic Surgery

## 2017-03-20 ENCOUNTER — Ambulatory Visit (HOSPITAL_BASED_OUTPATIENT_CLINIC_OR_DEPARTMENT_OTHER): Payer: 59 | Admitting: Anesthesiology

## 2017-03-20 ENCOUNTER — Other Ambulatory Visit: Payer: Self-pay

## 2017-03-20 DIAGNOSIS — M109 Gout, unspecified: Secondary | ICD-10-CM | POA: Diagnosis not present

## 2017-03-20 DIAGNOSIS — Z7982 Long term (current) use of aspirin: Secondary | ICD-10-CM | POA: Insufficient documentation

## 2017-03-20 DIAGNOSIS — E785 Hyperlipidemia, unspecified: Secondary | ICD-10-CM | POA: Diagnosis not present

## 2017-03-20 DIAGNOSIS — Z888 Allergy status to other drugs, medicaments and biological substances status: Secondary | ICD-10-CM | POA: Diagnosis not present

## 2017-03-20 DIAGNOSIS — I10 Essential (primary) hypertension: Secondary | ICD-10-CM | POA: Diagnosis not present

## 2017-03-20 DIAGNOSIS — Z79899 Other long term (current) drug therapy: Secondary | ICD-10-CM | POA: Insufficient documentation

## 2017-03-20 DIAGNOSIS — Z9889 Other specified postprocedural states: Secondary | ICD-10-CM

## 2017-03-20 DIAGNOSIS — G5762 Lesion of plantar nerve, left lower limb: Secondary | ICD-10-CM | POA: Diagnosis not present

## 2017-03-20 DIAGNOSIS — Z87442 Personal history of urinary calculi: Secondary | ICD-10-CM | POA: Insufficient documentation

## 2017-03-20 HISTORY — DX: Lesion of plantar nerve, left lower limb: G57.62

## 2017-03-20 HISTORY — PX: EXCISION MORTON'S NEUROMA: SHX5013

## 2017-03-20 LAB — POCT I-STAT, CHEM 8
BUN: 21 mg/dL — AB (ref 6–20)
CALCIUM ION: 1.25 mmol/L (ref 1.15–1.40)
Chloride: 103 mmol/L (ref 101–111)
Creatinine, Ser: 0.8 mg/dL (ref 0.61–1.24)
Glucose, Bld: 102 mg/dL — ABNORMAL HIGH (ref 65–99)
HCT: 45 % (ref 39.0–52.0)
Hemoglobin: 15.3 g/dL (ref 13.0–17.0)
Potassium: 4.1 mmol/L (ref 3.5–5.1)
SODIUM: 141 mmol/L (ref 135–145)
TCO2: 25 mmol/L (ref 22–32)

## 2017-03-20 SURGERY — EXCISION, MORTON'S NEUROMA
Anesthesia: General | Site: Foot | Laterality: Left

## 2017-03-20 MED ORDER — SCOPOLAMINE 1 MG/3DAYS TD PT72: 1.0000 | MEDICATED_PATCH | Freq: Once | TRANSDERMAL | Status: DC | PRN

## 2017-03-20 MED ORDER — PROMETHAZINE HCL 25 MG/ML IJ SOLN: 6.2500 mg | INTRAMUSCULAR | Status: DC | PRN

## 2017-03-20 MED ORDER — OXYCODONE HCL 5 MG PO TABS
ORAL_TABLET | ORAL | Status: AC
  Filled 2017-03-20: qty 1

## 2017-03-20 MED ORDER — MIDAZOLAM HCL 2 MG/2ML IJ SOLN
INTRAMUSCULAR | Status: AC
  Filled 2017-03-20: qty 2

## 2017-03-20 MED ORDER — GABAPENTIN 300 MG PO CAPS
ORAL_CAPSULE | ORAL | Status: AC
  Filled 2017-03-20: qty 1

## 2017-03-20 MED ORDER — PHENYLEPHRINE 40 MCG/ML (10ML) SYRINGE FOR IV PUSH (FOR BLOOD PRESSURE SUPPORT)
PREFILLED_SYRINGE | INTRAVENOUS | Status: AC
  Filled 2017-03-20: qty 10

## 2017-03-20 MED ORDER — ACETAMINOPHEN 500 MG PO TABS
ORAL_TABLET | ORAL | Status: AC
  Filled 2017-03-20: qty 2

## 2017-03-20 MED ORDER — FENTANYL CITRATE (PF) 100 MCG/2ML IJ SOLN
INTRAMUSCULAR | Status: DC | PRN
  Administered 2017-03-20: 100 ug via INTRAVENOUS

## 2017-03-20 MED ORDER — PHENYLEPHRINE HCL 10 MG/ML IJ SOLN
INTRAMUSCULAR | Status: DC | PRN
  Administered 2017-03-20: 80 ug via INTRAVENOUS

## 2017-03-20 MED ORDER — FENTANYL CITRATE (PF) 100 MCG/2ML IJ SOLN
INTRAMUSCULAR | Status: AC
  Filled 2017-03-20: qty 2

## 2017-03-20 MED ORDER — SENNA 8.6 MG PO TABS: 2.0000 | ORAL_TABLET | Freq: Two times a day (BID) | ORAL | 0 refills | Status: DC

## 2017-03-20 MED ORDER — SODIUM CHLORIDE 0.9 % IV SOLN: INTRAVENOUS | Status: DC

## 2017-03-20 MED ORDER — CELECOXIB 200 MG PO CAPS
200.0000 mg | ORAL_CAPSULE | Freq: Once | ORAL | Status: AC
  Administered 2017-03-20: 200 mg via ORAL

## 2017-03-20 MED ORDER — CELECOXIB 200 MG PO CAPS
ORAL_CAPSULE | ORAL | Status: AC
  Filled 2017-03-20: qty 2

## 2017-03-20 MED ORDER — CHLORHEXIDINE GLUCONATE 4 % EX LIQD: 60.0000 mL | Freq: Once | CUTANEOUS | Status: DC

## 2017-03-20 MED ORDER — FENTANYL CITRATE (PF) 100 MCG/2ML IJ SOLN: 25.0000 ug | INTRAMUSCULAR | Status: DC | PRN

## 2017-03-20 MED ORDER — MIDAZOLAM HCL 2 MG/2ML IJ SOLN: 1.0000 mg | INTRAMUSCULAR | Status: DC | PRN

## 2017-03-20 MED ORDER — LIDOCAINE HCL (CARDIAC) 20 MG/ML IV SOLN
INTRAVENOUS | Status: DC | PRN
  Administered 2017-03-20: 30 mg via INTRAVENOUS

## 2017-03-20 MED ORDER — DEXAMETHASONE SODIUM PHOSPHATE 4 MG/ML IJ SOLN
INTRAMUSCULAR | Status: DC | PRN
  Administered 2017-03-20: 10 mg via INTRAVENOUS

## 2017-03-20 MED ORDER — DOCUSATE SODIUM 100 MG PO CAPS: 100.0000 mg | ORAL_CAPSULE | Freq: Two times a day (BID) | ORAL | 0 refills | Status: DC

## 2017-03-20 MED ORDER — CEFAZOLIN SODIUM-DEXTROSE 2-4 GM/100ML-% IV SOLN
2.0000 g | INTRAVENOUS | Status: AC
  Administered 2017-03-20: 2 g via INTRAVENOUS

## 2017-03-20 MED ORDER — ONDANSETRON HCL 4 MG/2ML IJ SOLN
INTRAMUSCULAR | Status: AC
  Filled 2017-03-20: qty 2

## 2017-03-20 MED ORDER — DEXAMETHASONE SODIUM PHOSPHATE 10 MG/ML IJ SOLN
INTRAMUSCULAR | Status: AC
  Filled 2017-03-20: qty 1

## 2017-03-20 MED ORDER — CEFAZOLIN SODIUM-DEXTROSE 2-4 GM/100ML-% IV SOLN
INTRAVENOUS | Status: AC
  Filled 2017-03-20: qty 100

## 2017-03-20 MED ORDER — FENTANYL CITRATE (PF) 100 MCG/2ML IJ SOLN: 50.0000 ug | INTRAMUSCULAR | Status: DC | PRN

## 2017-03-20 MED ORDER — ACETAMINOPHEN 500 MG PO TABS
1000.0000 mg | ORAL_TABLET | Freq: Once | ORAL | Status: AC
  Administered 2017-03-20: 1000 mg via ORAL

## 2017-03-20 MED ORDER — LIDOCAINE 2% (20 MG/ML) 5 ML SYRINGE
INTRAMUSCULAR | Status: AC
  Filled 2017-03-20: qty 5

## 2017-03-20 MED ORDER — BUPIVACAINE-EPINEPHRINE (PF) 0.5% -1:200000 IJ SOLN
INTRAMUSCULAR | Status: DC | PRN
  Administered 2017-03-20: 10 mL

## 2017-03-20 MED ORDER — GABAPENTIN 300 MG PO CAPS
300.0000 mg | ORAL_CAPSULE | Freq: Once | ORAL | Status: AC
  Administered 2017-03-20: 300 mg via ORAL

## 2017-03-20 MED ORDER — ONDANSETRON HCL 4 MG/2ML IJ SOLN
INTRAMUSCULAR | Status: DC | PRN
  Administered 2017-03-20: 4 mg via INTRAVENOUS

## 2017-03-20 MED ORDER — OXYCODONE HCL 5 MG PO TABS: 5.0000 mg | ORAL_TABLET | ORAL | 0 refills | Status: DC | PRN

## 2017-03-20 MED ORDER — LACTATED RINGERS IV SOLN
INTRAVENOUS | Status: DC
  Administered 2017-03-20: 07:00:00 via INTRAVENOUS

## 2017-03-20 MED ORDER — MIDAZOLAM HCL 5 MG/5ML IJ SOLN
INTRAMUSCULAR | Status: DC | PRN
  Administered 2017-03-20: 2 mg via INTRAVENOUS

## 2017-03-20 MED ORDER — PROPOFOL 10 MG/ML IV BOLUS
INTRAVENOUS | Status: DC | PRN
  Administered 2017-03-20: 150 mg via INTRAVENOUS
  Administered 2017-03-20: 50 mg via INTRAVENOUS

## 2017-03-20 MED ORDER — OXYCODONE HCL 5 MG PO TABS
5.0000 mg | ORAL_TABLET | ORAL | Status: DC | PRN
  Administered 2017-03-20: 5 mg via ORAL

## 2017-03-20 MED ORDER — BUPIVACAINE-EPINEPHRINE (PF) 0.5% -1:200000 IJ SOLN
INTRAMUSCULAR | Status: AC
  Filled 2017-03-20: qty 60

## 2017-03-20 MED ORDER — PROPOFOL 500 MG/50ML IV EMUL
INTRAVENOUS | Status: AC
  Filled 2017-03-20: qty 50

## 2017-03-20 SURGICAL SUPPLY — 58 items
BANDAGE ESMARK 6X9 LF (GAUZE/BANDAGES/DRESSINGS) ×1 IMPLANT
BLADE SURG 15 STRL LF DISP TIS (BLADE) ×2 IMPLANT
BLADE SURG 15 STRL SS (BLADE) ×4
BNDG COHESIVE 4X5 TAN STRL (GAUZE/BANDAGES/DRESSINGS) ×3 IMPLANT
BNDG CONFORM 2 STRL LF (GAUZE/BANDAGES/DRESSINGS) ×3 IMPLANT
BNDG ESMARK 6X9 LF (GAUZE/BANDAGES/DRESSINGS) ×3
CHLORAPREP W/TINT 26ML (MISCELLANEOUS) ×3 IMPLANT
CLOSURE WOUND 1/2 X4 (GAUZE/BANDAGES/DRESSINGS)
CORD BIPOLAR FORCEPS 12FT (ELECTRODE) IMPLANT
COVER BACK TABLE 60X90IN (DRAPES) ×3 IMPLANT
CUFF TOURNIQUET SINGLE 18IN (TOURNIQUET CUFF) ×3 IMPLANT
CUFF TOURNIQUET SINGLE 24IN (TOURNIQUET CUFF) IMPLANT
DRAPE EXTREMITY T 121X128X90 (DRAPE) ×3 IMPLANT
DRAPE SURG 17X23 STRL (DRAPES) IMPLANT
DRAPE U-SHAPE 47X51 STRL (DRAPES) ×3 IMPLANT
DRSG MEPITEL 4X7.2 (GAUZE/BANDAGES/DRESSINGS) ×3 IMPLANT
DRSG PAD ABDOMINAL 8X10 ST (GAUZE/BANDAGES/DRESSINGS) IMPLANT
ELECT REM PT RETURN 9FT ADLT (ELECTROSURGICAL) ×3
ELECTRODE REM PT RTRN 9FT ADLT (ELECTROSURGICAL) ×1 IMPLANT
GAUZE SPONGE 4X4 12PLY STRL (GAUZE/BANDAGES/DRESSINGS) ×3 IMPLANT
GLOVE BIO SURGEON STRL SZ 6.5 (GLOVE) ×2 IMPLANT
GLOVE BIO SURGEON STRL SZ8 (GLOVE) ×3 IMPLANT
GLOVE BIO SURGEONS STRL SZ 6.5 (GLOVE) ×1
GLOVE BIOGEL PI IND STRL 7.0 (GLOVE) ×1 IMPLANT
GLOVE BIOGEL PI IND STRL 7.5 (GLOVE) ×2 IMPLANT
GLOVE BIOGEL PI IND STRL 8 (GLOVE) ×2 IMPLANT
GLOVE BIOGEL PI INDICATOR 7.0 (GLOVE) ×2
GLOVE BIOGEL PI INDICATOR 7.5 (GLOVE) ×4
GLOVE BIOGEL PI INDICATOR 8 (GLOVE) ×4
GLOVE ECLIPSE 6.5 STRL STRAW (GLOVE) ×3 IMPLANT
GLOVE ECLIPSE 8.0 STRL XLNG CF (GLOVE) ×3 IMPLANT
GOWN STRL REUS W/ TWL LRG LVL3 (GOWN DISPOSABLE) ×2 IMPLANT
GOWN STRL REUS W/ TWL XL LVL3 (GOWN DISPOSABLE) ×2 IMPLANT
GOWN STRL REUS W/TWL LRG LVL3 (GOWN DISPOSABLE) ×4
GOWN STRL REUS W/TWL XL LVL3 (GOWN DISPOSABLE) ×4
NEEDLE HYPO 25X1 1.5 SAFETY (NEEDLE) ×3 IMPLANT
NS IRRIG 1000ML POUR BTL (IV SOLUTION) ×3 IMPLANT
PACK BASIN DAY SURGERY FS (CUSTOM PROCEDURE TRAY) ×3 IMPLANT
PAD CAST 4YDX4 CTTN HI CHSV (CAST SUPPLIES) ×1 IMPLANT
PADDING CAST COTTON 4X4 STRL (CAST SUPPLIES) ×2
PENCIL BUTTON HOLSTER BLD 10FT (ELECTRODE) ×3 IMPLANT
SANITIZER HAND PURELL 535ML FO (MISCELLANEOUS) ×3 IMPLANT
SHEET MEDIUM DRAPE 40X70 STRL (DRAPES) ×3 IMPLANT
SLEEVE SCD COMPRESS KNEE MED (MISCELLANEOUS) ×3 IMPLANT
SPONGE LAP 18X18 X RAY DECT (DISPOSABLE) ×3 IMPLANT
STOCKINETTE 6  STRL (DRAPES) ×2
STOCKINETTE 6 STRL (DRAPES) ×1 IMPLANT
STRIP CLOSURE SKIN 1/2X4 (GAUZE/BANDAGES/DRESSINGS) IMPLANT
SUCTION FRAZIER HANDLE 10FR (MISCELLANEOUS)
SUCTION TUBE FRAZIER 10FR DISP (MISCELLANEOUS) IMPLANT
SUT ETHILON 3 0 PS 1 (SUTURE) ×3 IMPLANT
SUT MNCRL AB 3-0 PS2 18 (SUTURE) IMPLANT
SYR BULB 3OZ (MISCELLANEOUS) ×3 IMPLANT
SYR CONTROL 10ML LL (SYRINGE) ×3 IMPLANT
TOWEL OR 17X24 6PK STRL BLUE (TOWEL DISPOSABLE) ×3 IMPLANT
TUBE CONNECTING 20'X1/4 (TUBING)
TUBE CONNECTING 20X1/4 (TUBING) IMPLANT
UNDERPAD 30X30 (UNDERPADS AND DIAPERS) ×3 IMPLANT

## 2017-03-20 NOTE — Anesthesia Preprocedure Evaluation (Addendum)
Anesthesia Evaluation  Patient identified by MRN, date of birth, ID band Patient awake    Reviewed: Allergy & Precautions, NPO status , Patient's Chart, lab work & pertinent test results  History of Anesthesia Complications Negative for: history of anesthetic complications  Airway Mallampati: III  TM Distance: <3 FB Neck ROM: Full    Dental  (+) Teeth Intact, Dental Advisory Given   Pulmonary neg pulmonary ROS,    Pulmonary exam normal breath sounds clear to auscultation       Cardiovascular hypertension, Pt. on medications Normal cardiovascular exam Rhythm:Regular Rate:Normal     Neuro/Psych negative neurological ROS     GI/Hepatic negative GI ROS, Neg liver ROS,   Endo/Other  negative endocrine ROS  Renal/GU negative Renal ROS     Musculoskeletal Morton's neuroma left foot   Abdominal   Peds  Hematology negative hematology ROS (+)   Anesthesia Other Findings Day of surgery medications reviewed with the patient.  Reproductive/Obstetrics                            Anesthesia Physical Anesthesia Plan  ASA: II  Anesthesia Plan: General   Post-op Pain Management:    Induction: Intravenous  PONV Risk Score and Plan: 2 and Dexamethasone, Ondansetron and Midazolam  Airway Management Planned: LMA  Additional Equipment:   Intra-op Plan:   Post-operative Plan: Extubation in OR  Informed Consent: I have reviewed the patients History and Physical, chart, labs and discussed the procedure including the risks, benefits and alternatives for the proposed anesthesia with the patient or authorized representative who has indicated his/her understanding and acceptance.   Dental advisory given  Plan Discussed with: CRNA  Anesthesia Plan Comments: (Risks/benefits of general anesthesia discussed with patient including risk of damage to teeth, lips, gum, and tongue, nausea/vomiting, allergic  reactions to medications, and the possibility of heart attack, stroke and death.  All patient questions answered.  Patient wishes to proceed.)        Anesthesia Quick Evaluation

## 2017-03-20 NOTE — H&P (Signed)
Vincent Mendoza is an 62 y.o. male.   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  He has a long history of left forefoot pain.  He has failed treatment to date including shoe wear modifications, oral anti-inflammatories and chiropractic manipulation.  His signs and symptoms are consistent with a third webspace Morton's neuroma.  He presents today for operative treatment of this painful condition.  Past Medical History:  Diagnosis Date  . Diverticulosis   . Gout   . Hyperlipidemia   . Hypertension    stopped taking Lisinopril after a week  . Inguinal hernia    right  . Kidney stone   . Morton's neuroma of left foot   . Wears glasses     Past Surgical History:  Procedure Laterality Date  . COLONOSCOPY    . INGUINAL HERNIA REPAIR Bilateral 12/29/2014   Procedure: LAPAROSCOPIC BILATERAL INGUINAL HERNIA REPAIR;  Surgeon: Ralene Ok, MD;  Location: [facility];  Service: General;  Laterality: Bilateral;  . INSERTION OF MESH Right 12/29/2014   Procedure: INSERTION OF MESH;  Surgeon: Ralene Ok, MD;  Location: [facility];  Service: General;  Laterality: Right;  . LAPAROSCOPY N/A 12/29/2014   Procedure: LAPAROSCOPY DIAGNOSTIC;  Surgeon: Ralene Ok, MD;  Location: [facility];  Service: General;  Laterality: N/A;  . LITHOTRIPSY     x3  . LIVER BIOPSY N/A 12/29/2014   Procedure: LIVER BIOPSY;  Surgeon: Ralene Ok, MD;  Location: [facility];  Service: General;  Laterality: N/A;  . TONSILLECTOMY    . VASECTOMY      Family History  Problem Relation Age of Onset  . Heart disease Mother   . Heart disease Father   . Pancreatic cancer Maternal Aunt   . Heart disease Maternal Grandfather   . Heart disease Paternal Grandfather   . Colon cancer Neg Hx    Social History:  reports that  has never smoked. he has never used smokeless tobacco. He reports that he drinks alcohol. He reports that he does not use drugs.  Allergies:   Allergies  Allergen Reactions  . Lisinopril Cough    Medications Prior to Admission  Medication Sig Dispense Refill  . allopurinol (ZYLOPRIM) 100 MG tablet Take 100 mg by mouth daily.    Marland Kitchen aspirin EC 81 MG tablet Take 81 mg by mouth daily.    Marland Kitchen losartan-hydrochlorothiazide (HYZAAR) 100-12.5 MG tablet Take 1 tablet by mouth daily.    . Omega-3 Fatty Acids (FISH OIL PO) Take 2,000 mg by mouth daily.     . potassium citrate (UROCIT-K) 10 MEQ (1080 MG) SR tablet     . rosuvastatin (CRESTOR) 20 MG tablet Take 20 mg by mouth daily.      Results for orders placed or performed during the hospital encounter of 03/20/17 (from the past 48 hour(s))  I-STAT, chem 8     Status: Abnormal   Collection Time: 03/20/17  6:52 AM  Result Value Ref Range   Sodium 141 135 - 145 mmol/L   Potassium 4.1 3.5 - 5.1 mmol/L   Chloride 103 101 - 111 mmol/L   BUN 21 (H) 6 - 20 mg/dL   Creatinine, Ser 0.80 0.61 - 1.24 mg/dL   Glucose, Bld 102 (H) 65 - 99 mg/dL   Calcium, Ion 1.25 1.15 - 1.40 mmol/L   TCO2 25 22 - 32 mmol/L   Hemoglobin 15.3 13.0 - 17.0 g/dL   HCT 45.0 39.0 - 52.0 %  No results found.  ROS no recent fever, chills, nausea, vomiting or changes in his appetite.  Blood pressure 124/82, pulse (!) 58, temperature 97.9 F (36.6 C), temperature source Oral, resp. rate 18, height 5\' 9"  (1.753 m), weight 87 kg (191 lb 12.8 oz), SpO2 99 %. Physical Exam  Well-nourished well-developed man in no apparent distress.  Alert and oriented x4.  Mood and affect are normal.  Extraocular motions are intact.  Respirations are unlabored.  Gait is normal.  Left foot has healthy and intact skin, palpable pulses and intact sensibility to light touch dorsally and plantarly at the forefoot.  5 out of 5 strength in plantar flexion and dorsiflexion of the toes.  No lymphadenopathy.  He is tender to palpation at the third webspace with dorsal and plantar compression.  He also has pain with medial and lateral  compression.  Assessment/Plan Left foot third webspace Morton's neuroma -to the operating room today for excision of this neuroma.  The risks and benefits of the alternative treatment options have been discussed in detail.  The patient wishes to proceed with surgery and specifically understands risks of bleeding, infection, nerve damage, blood clots, need for additional surgery, amputation and death.   Wylene Simmer, MD 2017/03/25, 7:27 AM

## 2017-03-20 NOTE — Op Note (Signed)
03/20/2017  8:08 AM  PATIENT:  Vincent Mendoza  62 y.o. male  PRE-OPERATIVE DIAGNOSIS:  Left 3rd webspace Morton's Neuroma  POST-OPERATIVE DIAGNOSIS:  Same  Procedure(s):  Excision of left foot 3rd webspace Moron's neuroma  SURGEON:  Wylene Simmer, MD  ASSISTANT: Mechele Claude, PA-C  ANESTHESIA:   General  EBL:  minimal   TOURNIQUET:   Total Tourniquet Time Documented: Calf (Left) - 22 minutes Total: Calf (Left) - 22 minutes  COMPLICATIONS:  None apparent  DISPOSITION:  Extubated, awake and stable to recovery.  INDICATION FOR PROCEDURE: The patient is a 62 year old male with a long history of left forefoot pain.  Signs and symptoms are consistent with a Morton's neuroma.  He has failed nonoperative treatment to date including activity modification, shoewear modification, oral anti-inflammatories and practic manipulation.  He presents today for excision of this Morton's neuroma.  He understands the risks and benefits of the alternative treatment options and elects surgical treatment.  He specifically understands risks of bleeding, infection, nerve damage, blood clots, recurrence of his neuroma, amputation and death.  PROCEDURE IN DETAIL: After preoperative consent was obtained and the correct operative site was identified, the patient was brought to the operating room and placed supine on the operating table.  General anesthesia was induced.  Preoperative antibiotics were administered.  Surgical timeout was taken.  Left lower extremity was prepped and draped in standard sterile fashion with a tourniquet around the calf.  The extremity was exsanguinated and the tourniquet was inflated to 200 mmHg.  A longitudinal incision was then made the dorsum of the third webspace.  Dissection was carried down through the subtendinous tissues to the level of the intermetatarsal ligament.  The ligament was divided under direct vision.  The intermetatarsal nerve was identified.  It was dissected free  from the surrounding structures.  It was transected proximally at the level of the interossei.  It was traced distally past the bifurcation and transected medially and laterally.  It was noted to be quite thickened proximally and distally with an hourglass area of compression centrally.  The wound was then irrigated copiously.  The skin incision was closed with horizontal mattress sutures of 3-0 nylon.  Local anesthetic was infiltrated at the operative site for postoperative pain control.  Sterile dressings were applied followed by a compression wrap.  The tourniquet was released after application of the dressings.  The patient was awakened from anesthesia and transported to the recovery room in stable condition.   FOLLOW UP PLAN: The patient will be weightbearing as tolerated in a flat postop shoe.  Follow-up with me in 2 weeks for suture removal.   Mechele Claude PA-C was present and scrubbed for the duration of the operative case. His assistance assistance was essential in positioning the patient, prepping and draping, gaining maintaining exposure, performing the operation, closing and dressing the wounds and applying the splint.

## 2017-03-20 NOTE — Anesthesia Postprocedure Evaluation (Signed)
Anesthesia Post Note  Patient: Vincent Mendoza  Procedure(s) Performed: Left 3-4 Morton's Neuroma Excision (Left Foot)     Patient location during evaluation: PACU Anesthesia Type: General Level of consciousness: awake and alert Pain management: pain level controlled Vital Signs Assessment: post-procedure vital signs reviewed and stable Respiratory status: spontaneous breathing, nonlabored ventilation and respiratory function stable Cardiovascular status: blood pressure returned to baseline and stable Postop Assessment: no apparent nausea or vomiting Anesthetic complications: no    Last Vitals:  Vitals:   03/20/17 0845 03/20/17 0918  BP: 121/76 117/87  Pulse: (!) 56 (!) 51  Resp: 17   Temp:  36.6 C  SpO2: 100% 100%    Last Pain:  Vitals:   03/20/17 0918  TempSrc: Oral  PainSc: 2                  Catalina Gravel

## 2017-03-20 NOTE — Discharge Instructions (Addendum)
Vincent Simmer, MD Vincent Mendoza  Please read the following information regarding your care after surgery.  Medications  You only need a prescription for the narcotic pain medicine (ex. oxycodone, Percocet, Norco).  All of the other medicines listed below are available over the counter. X Aleve 2 pills twice a day for the first 3 days after surgery. X acetominophen (Tylenol) 650 mg every 4-6 hours as you need for minor to moderate pain X oxycodone as prescribed for severe pain  Narcotic pain medicine (ex. oxycodone, Percocet, Vicodin) will cause constipation.  To prevent this problem, take the following medicines while you are taking any pain medicine. X docusate sodium (Colace) 100 mg twice a day X senna (Senokot) 2 tablets twice a day  Weight Bearing X Bear weight when you are able on your operated leg or foot in the flat post-op shoe.  Cast / Splint / Dressing X Keep your splint, cast or dressing clean and dry.  Dont put anything (coat hanger, pencil, etc) down inside of it.  If it gets damp, use a hair dryer on the cool setting to dry it.  If it gets soaked, call the office to schedule an appointment for a cast change.   After your dressing, cast or splint is removed; you may shower, but do not soak or scrub the wound.  Allow the water to run over it, and then gently pat it dry.  Swelling It is normal for you to have swelling where you had surgery.  To reduce swelling and pain, keep your toes above your nose for at least 3 days after surgery.  It may be necessary to keep your foot or leg elevated for several weeks.  If it hurts, it should be elevated.  Follow Up Call my office at [phone] when you are discharged from the hospital or surgery center to schedule an appointment to be seen two weeks after surgery.  Call my office at [phone] if you develop a fever >101.5 F, nausea, vomiting, bleeding from the surgical site or severe pain.     Post Anesthesia Home Care  Instructions  Activity: Get plenty of rest for the remainder of the day. A responsible individual must stay with you for 24 hours following the procedure.  For the next 24 hours, DO NOT: -Drive a car -Paediatric nurse -Drink alcoholic beverages -Take any medication unless instructed by your physician -Make any legal decisions or sign important papers.  Meals: Start with liquid foods such as gelatin or soup. Progress to regular foods as tolerated. Avoid greasy, spicy, heavy foods. If nausea and/or vomiting occur, drink only clear liquids until the nausea and/or vomiting subsides. Call your physician if vomiting continues.  Special Instructions/Symptoms: Your throat may feel dry or sore from the anesthesia or the breathing tube placed in your throat during surgery. If this causes discomfort, gargle with warm salt water. The discomfort should disappear within 24 hours.  If you had a scopolamine patch placed behind your ear for the management of post- operative nausea and/or vomiting:  1. The medication in the patch is effective for 72 hours, after which it should be removed.  Wrap patch in a tissue and discard in the trash. Wash hands thoroughly with soap and water. 2. You may remove the patch earlier than 72 hours if you experience unpleasant side effects which may include dry mouth, dizziness or visual disturbances. 3. Avoid touching the patch. Wash your hands with soap and water after contact with the patch.

## 2017-03-20 NOTE — Anesthesia Procedure Notes (Signed)
Procedure Name: LMA Insertion Date/Time: 03/20/2017 7:36 AM Performed by: Marrianne Mood, CRNA Pre-anesthesia Checklist: Patient identified, Emergency Drugs available, Suction available, Patient being monitored and Timeout performed Patient Re-evaluated:Patient Re-evaluated prior to induction Oxygen Delivery Method: Circle system utilized Preoxygenation: Pre-oxygenation with 100% oxygen Induction Type: IV induction Ventilation: Mask ventilation without difficulty LMA: LMA inserted LMA Size: 5.0 Number of attempts: 1 Airway Equipment and Method: Bite block Placement Confirmation: positive ETCO2 Tube secured with: Tape Dental Injury: Teeth and Oropharynx as per pre-operative assessment

## 2017-03-20 NOTE — Transfer of Care (Signed)
Immediate Anesthesia Transfer of Care Note  Patient: Vincent Mendoza  Procedure(s) Performed: Left 3-4 Morton's Neuroma Excision (Left Foot)  Patient Location: PACU  Anesthesia Type:General  Level of Consciousness: sedated  Airway & Oxygen Therapy: Patient Spontanous Breathing and Patient connected to face mask oxygen  Post-op Assessment: Report given to RN and Post -op Vital signs reviewed and stable  Post vital signs: Reviewed and stable  Last Vitals:  Vitals:   03/20/17 0633  BP: 124/82  Pulse: (!) 58  Resp: 18  Temp: 36.6 C  SpO2: 99%    Last Pain:  Vitals:   03/20/17 0633  TempSrc: Oral      Patients Stated Pain Goal: 0 (41/03/01 3143)  Complications: No apparent anesthesia complications

## 2017-03-21 ENCOUNTER — Encounter (HOSPITAL_BASED_OUTPATIENT_CLINIC_OR_DEPARTMENT_OTHER): Payer: Self-pay | Admitting: Orthopedic Surgery

## 2018-01-26 DIAGNOSIS — R82998 Other abnormal findings in urine: Secondary | ICD-10-CM | POA: Diagnosis not present

## 2018-02-02 DIAGNOSIS — R7301 Impaired fasting glucose: Secondary | ICD-10-CM | POA: Diagnosis not present

## 2018-02-02 DIAGNOSIS — E785 Hyperlipidemia, unspecified: Secondary | ICD-10-CM | POA: Diagnosis not present

## 2018-02-02 DIAGNOSIS — Z1389 Encounter for screening for other disorder: Secondary | ICD-10-CM | POA: Diagnosis not present

## 2018-02-02 DIAGNOSIS — E663 Overweight: Secondary | ICD-10-CM | POA: Diagnosis not present

## 2018-02-02 DIAGNOSIS — I10 Essential (primary) hypertension: Secondary | ICD-10-CM | POA: Diagnosis not present

## 2018-02-02 DIAGNOSIS — Z0000 Encounter for general adult medical examination without abnormal findings: Secondary | ICD-10-CM | POA: Diagnosis not present

## 2018-02-25 DIAGNOSIS — I219 Acute myocardial infarction, unspecified: Secondary | ICD-10-CM

## 2018-02-25 HISTORY — DX: Acute myocardial infarction, unspecified: I21.9

## 2018-04-29 ENCOUNTER — Ambulatory Visit: Payer: BLUE CROSS/BLUE SHIELD | Admitting: Internal Medicine

## 2018-04-29 ENCOUNTER — Encounter: Payer: Self-pay | Admitting: Internal Medicine

## 2018-04-29 VITALS — BP 100/70 | HR 69 | Ht 70.0 in | Wt 190.4 lb

## 2018-04-29 DIAGNOSIS — I214 Non-ST elevation (NSTEMI) myocardial infarction: Secondary | ICD-10-CM

## 2018-04-29 DIAGNOSIS — I10 Essential (primary) hypertension: Secondary | ICD-10-CM

## 2018-04-29 DIAGNOSIS — I2510 Atherosclerotic heart disease of native coronary artery without angina pectoris: Secondary | ICD-10-CM

## 2018-04-29 DIAGNOSIS — E781 Pure hyperglyceridemia: Secondary | ICD-10-CM | POA: Diagnosis not present

## 2018-04-29 DIAGNOSIS — Z9861 Coronary angioplasty status: Secondary | ICD-10-CM

## 2018-04-29 MED ORDER — ICOSAPENT ETHYL 1 G PO CAPS: 2.0000 | ORAL_CAPSULE | Freq: Two times a day (BID) | ORAL | 3 refills | Status: DC

## 2018-04-29 MED ORDER — ICOSAPENT ETHYL 1 G PO CAPS: 2.0000 | ORAL_CAPSULE | Freq: Two times a day (BID) | ORAL | 0 refills | Status: DC

## 2018-04-29 NOTE — Progress Notes (Signed)
OFFICE CONSULT NOTE  Chief Complaint:  Recent NSTEMI  Primary Care Physician: Prince Solian, MD  HPI:  Vincent Mendoza is a 63 y.o. male who is being seen today for the evaluation of recent NSTEMI at the request of Avva, Ravisankar, MD. 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  According to his records, he presented at [facility] after awakening from his dead sleep at 3 AM.  He had left-sided shoulder pain radiating anteriorly of the mid chest and neck.  He said it lasted for more than 10 minutes was noted to be about a 4 out of 10 chest pain.  He presented to the emergency department and initial troponin was elevated 0.1.  Subsequent troponins were 0.35 and 0.63.  He was diagnosed with a non-STEMI and treated appropriately.  He did undergo left heart catheterization on 04/20/2018 at [facility].  This demonstrated an 80% proximal LAD stenosis and a 40% distal LAD stenosis.  There was a 50% mid RCA lesion as well.  Subsequently he underwent balloon angioplasty and placement of a 2.5 x 18 mm Onyx resolute drug-eluting stent.  This was postdilated with a 3.0 x 15 mm Quantum apex balloon.  TIMI-3 flow was noted.  He had an echocardiogram as well which showed normal systolic function, mild LVH, very mild hypokinesis of the basal anterior mid anterior and apical anterior walls.  Grade 1 diastolic dysfunction was noted with mild aortic insufficiency.  Since stenting he is done well.  He is got some occasional sharp chest discomfort but no further anginal symptoms.  He has started walking more regularly and says he is committed to changing his dietary habits.  Although he has a history of dyslipidemia he had been on high potency statin therapy with Crestor 20 mg daily.  During a recent physical with his PCP, his labs indicated total cholesterol 158, HDL 50, LDL 79 and triglycerides 147.  He had repeat labs during his hospitalization which showed total  cholesterol of 130, triglycerides 197, HDL 42 and LDL of 49.  PMHx:  Past Medical History:  Diagnosis Date  . Diverticulosis   . Gout   . Hyperlipidemia   . Hypertension    stopped taking Lisinopril after a week  . Inguinal hernia    right  . Kidney stone   . Morton's neuroma of left foot   . Wears glasses     Past Surgical History:  Procedure Laterality Date  . COLONOSCOPY    . EXCISION MORTON'S NEUROMA Left 03/20/2017   Procedure: Left 3-4 Morton's Neuroma Excision;  Surgeon: Wylene Simmer, MD;  Location: [facility];  Service: Orthopedics;  Laterality: Left;  . INGUINAL HERNIA REPAIR Bilateral 12/29/2014   Procedure: LAPAROSCOPIC BILATERAL INGUINAL HERNIA REPAIR;  Surgeon: Ralene Ok, MD;  Location: [facility];  Service: General;  Laterality: Bilateral;  . INSERTION OF MESH Right 12/29/2014   Procedure: INSERTION OF MESH;  Surgeon: Ralene Ok, MD;  Location: [facility];  Service: General;  Laterality: Right;  . LAPAROSCOPY N/A 12/29/2014   Procedure: LAPAROSCOPY DIAGNOSTIC;  Surgeon: Ralene Ok, MD;  Location: [facility];  Service: General;  Laterality: N/A;  . LITHOTRIPSY     x3  . LIVER BIOPSY N/A 12/29/2014   Procedure: LIVER BIOPSY;  Surgeon: Ralene Ok, MD;  Location: [facility];  Service: General;  Laterality: N/A;  . TONSILLECTOMY    . VASECTOMY      FAMHx:  Family History  Problem Relation Age of Onset  . Heart disease Mother   . Heart disease Father   . Pancreatic cancer Maternal Aunt   . Heart disease Maternal Grandfather   . Heart disease Paternal Grandfather   . Colon cancer Neg Hx     SOCHx:   reports that he has never smoked. He has never used smokeless tobacco. He reports current alcohol use. He reports that he does not use drugs.  ALLERGIES:  Allergies  Allergen Reactions  . Lisinopril Cough    ROS: Pertinent items noted in HPI and remainder of comprehensive ROS otherwise negative.  HOME MEDS: Current Outpatient Medications on  File Prior to Visit  Medication Sig Dispense Refill  . allopurinol (ZYLOPRIM) 100 MG tablet Take 100 mg by mouth daily.    Marland Kitchen aspirin EC 81 MG tablet Take 81 mg by mouth daily.    . clopidogrel (PLAVIX) 75 MG tablet Take 75 mg by mouth daily.    Marland Kitchen losartan-hydrochlorothiazide (HYZAAR) 100-12.5 MG tablet Take 1 tablet by mouth daily.    . metoprolol tartrate (LOPRESSOR) 25 MG tablet Take 25 mg by mouth 2 (two) times daily.    . nitroGLYCERIN (NITROSTAT) 0.4 MG SL tablet Place 0.4 mg under the tongue every 5 (five) minutes as needed for chest pain.    . potassium citrate (UROCIT-K) 10 MEQ (1080 MG) SR tablet     . rosuvastatin (CRESTOR) 20 MG tablet Take 20 mg by mouth daily.     No current facility-administered medications on file prior to visit.     LABS/IMAGING: No results found for this or any previous visit (from the past 48 hour(s)). No results found.  LIPID PANEL: No results found for: CHOL, TRIG, HDL, CHOLHDL, VLDL, LDLCALC, LDLDIRECT  WEIGHTS: Wt Readings from Last 3 Encounters:  04/29/18 190 lb 6.4 oz (86.4 kg)  03/20/17 191 lb 12.8 oz (87 kg)  05/22/16 192 lb (87.1 kg)    VITALS: BP 100/70   Pulse 69   Ht 5\' 10"  (1.778 m)   Wt 190 lb 6.4 oz (86.4 kg)   BMI 27.32 kg/m   EXAM: General appearance: alert and no distress Neck: no carotid bruit, no JVD and thyroid not enlarged, symmetric, no tenderness/mass/nodules Lungs: clear to auscultation bilaterally Heart: regular rate and rhythm, S1, S2 normal and diastolic murmur: early diastolic 2/6, blowing at 2nd right intercostal space Abdomen: soft, non-tender; bowel sounds normal; no masses,  no organomegaly Extremities: extremities normal, atraumatic, no cyanosis or edema Pulses: 2+ and symmetric Skin: Skin color, texture, turgor normal. No rashes or lesions Neurologic: Grossly normal Psych: Pleasant  EKG: Sinus rhythm 69, cannot rule out anterior infarct- personally reviewed  ASSESSMENT: 1. NSTEMI 2. CAD status  post DES to the proximal LAD with residual 50% mid RCA disease and distal LAD disease (04/20/2018) 3. Dyslipidemia 4. Hypertension 5. Family history of coronary disease in both parents  PLAN: 1.   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 He is on appropriate medications, however, most of the meds he was taking before his heart attack. I think this speaks to his persistent risk beyond statins. LDL was reasonably well-controlled at 79, however, triglycerides were elevated at 147. Not surprisingly, immediately post-MI, LDL was down and triglycerides were up. Data suggests he is still at risk for events. He is a good candidate to add Vascepa (based on data from the REDUCE-IT trial), which demonstrated 25% RRR for hard  MACE events. I will accordingly stop his fish oil - which has not shown benefit in randomized, placebo controlled studies and may give a trend toward harm.  Plan repeat lipid profile and follow-up with me in 6 months. He expressed his wish to do his own cardiac rehab which I'm ok with.  Pixie Casino, MD, [facility], [place] Director of the Advanced Lipid Disorders &  Cardiovascular Risk Reduction Clinic Diplomate of the American Board of Clinical Lipidology Attending Cardiologist  Direct Dial: [phone]  Fax: [phone]  Website:  [email or web address] 04/29/2018, 9:23 PM

## 2018-04-29 NOTE — Patient Instructions (Addendum)
Medication Instructions:  STOP OVER THE COUNTER FISH OIL  START VASCEPA 2 GM TWICE A DAY  If you need a refill on your cardiac medications before your next appointment, please call your pharmacy.   Lab work: LABS IN 6 MONTHS - FASTING LAB  LIPID - WILL MAIL LAB SLIP  If you have labs (blood work) drawn today and your tests are completely normal, you will receive your results only by: Marland Kitchen MyChart Message (if you have MyChart) OR . A paper copy in the mail If you have any lab test that is abnormal or we need to change your treatment, we will call you to review the results.  Testing/Procedures: NOT NEEDED  Follow-Up: At [facility], you and your health needs are our priority [place].  As part of our continuing mission to provide you with exceptional heart care, we have created designated Provider Care Teams.  These Care Teams include your primary Cardiologist (physician) and Advanced Practice Providers (APPs -  Physician Assistants and Nurse Practitioners) who all work together to provide you with the care you need, when you need it. You will need a follow up appointment in [place] .  Please call our office 2 months in advance to schedule this appointment.  You may see Pixie Casino, MD or one of the following Advanced Practice Providers on your designated Care Team: [place], [place] . Fabian Sharp, PA-C  Any Other Special Instructions Will Be Listed Below (If Applicable).

## 2018-04-30 ENCOUNTER — Telehealth: Payer: Self-pay | Admitting: *Deleted

## 2018-04-30 NOTE — Telephone Encounter (Signed)
Contacted cover my med - neede more asistance  Was instructed to contact  [employer] -   information given   dx E78.1, I21.4 PATIENT HAS USED FISH OIL OTC , ROSUVASTATIN   PER REPRESENTATIVE  - INFORMATION SENT TO PLAN FOR PRIOR AUTHORIZATION  THE PLAN WILL FAX BACK ANSWER.

## 2018-05-01 NOTE — Telephone Encounter (Signed)
[employer] calling returning Vincent Mendoza's call. [employer] asked that the form sent to the office be filled out and faxed back to them by 5pm Monday

## 2018-05-04 NOTE — Telephone Encounter (Signed)
Requested documentation faxed back to [employer] along with OV note from 04/29/18.

## 2018-05-05 NOTE — Telephone Encounter (Signed)
PA denial received from [employer] for Vascepa Reference # [id number] MD made aware

## 2018-05-08 ENCOUNTER — Telehealth: Payer: Self-pay | Admitting: Internal Medicine

## 2018-05-08 NOTE — Telephone Encounter (Signed)
Message sent to Dr.Hilty for advice. 

## 2018-05-08 NOTE — Telephone Encounter (Signed)
  Pt c/o medication issue:  1. Name of Medication: Icosapent Ethyl (VASCEPA) 1 g CAPS  2. How are you currently taking this medication (dosage and times per day)? As directed  3. Are you having a reaction (difficulty breathing--STAT)?  No  4. What is your medication issue? Pharmacist wants to know if they can substitute the generic for this drug due to the price. Would like to use lovava in its place since it is much cheaper

## 2018-05-08 NOTE — Telephone Encounter (Signed)
Lovaza is not equivalent to Vascepa - it does not have the same beneficial effects and the [place] in [place] raises LDL, possibly increasing risk. Please let him know that I filled an appeal for his medication denial and will continue to work on it.  Dr. Lemmie Evens

## 2018-05-08 NOTE — Telephone Encounter (Signed)
Returned call to patient left Dr.Hilty's recommendation on personal voice mail.Advised to call back if he has any questions.

## 2018-05-13 NOTE — Telephone Encounter (Signed)
Appeal for Vascepa PA submitted today by Dr. Debara Pickett. Awaiting response.

## 2018-06-09 NOTE — Telephone Encounter (Signed)
Received paperwork from [employer] for member consent for provider to file appeal. Form filled out by patient and provider and fax back to [employer].

## 2018-06-25 ENCOUNTER — Telehealth: Payer: Self-pay | Admitting: Internal Medicine

## 2018-06-25 NOTE — Telephone Encounter (Signed)
Follow UP: Reference#[id number]    Following up on the appeal for pt's Vascepa.

## 2018-06-26 NOTE — Telephone Encounter (Signed)
Spoke with [email or web address] rep. They will fax over a hard copy of appeal letter for MD completion  Insurance plan ([employer]) appeal # [phone] if call needs to be made

## 2018-06-26 NOTE — Telephone Encounter (Signed)
Can you check to see if this appeal went through?  Thanks

## 2018-06-29 ENCOUNTER — Telehealth: Payer: Self-pay | Admitting: Internal Medicine

## 2018-06-29 NOTE — Telephone Encounter (Signed)
New Message    Sharyn Lull was leaving her contact information and acknowledging she received the information for the medication

## 2018-06-30 NOTE — Telephone Encounter (Signed)
Vascepa appeals form received for MD to complete

## 2018-07-03 NOTE — Telephone Encounter (Signed)
LM for Vincent Mendoza to check on status of appeal, has it been approved? additional info needed?

## 2018-07-13 NOTE — Telephone Encounter (Signed)
    [employer] Appeal # 1 [phone]  Pt ID- [id number]

## 2018-07-14 MED ORDER — ICOSAPENT ETHYL 1 G PO CAPS: 2.0000 | ORAL_CAPSULE | Freq: Two times a day (BID) | ORAL | 11 refills | Status: DC

## 2018-07-14 NOTE — Telephone Encounter (Signed)
Fax received from patient's insurance that medical necessity appeal committee has approved coverage of vascepa for this patient.   LM for patient that med has been approved, Rx will be sent to pharmacy, he can obtain co-pay card from [email or web address]

## 2018-07-14 NOTE — Telephone Encounter (Signed)
Encounter closed. Documentation noted in another phone encounter.

## 2018-07-14 NOTE — Telephone Encounter (Signed)
Hallelujia!

## 2018-10-14 ENCOUNTER — Institutional Professional Consult (permissible substitution): Payer: Self-pay | Admitting: Neurology

## 2018-11-16 ENCOUNTER — Encounter: Payer: Self-pay | Admitting: Neurology

## 2018-11-18 ENCOUNTER — Other Ambulatory Visit: Payer: Self-pay

## 2018-11-18 ENCOUNTER — Encounter: Payer: Self-pay | Admitting: Neurology

## 2018-11-18 ENCOUNTER — Ambulatory Visit (INDEPENDENT_AMBULATORY_CARE_PROVIDER_SITE_OTHER): Payer: BC Managed Care – PPO | Admitting: Neurology

## 2018-11-18 VITALS — BP 118/82 | HR 51 | Temp 97.3°F | Ht 70.0 in | Wt 189.0 lb

## 2018-11-18 DIAGNOSIS — G478 Other sleep disorders: Secondary | ICD-10-CM | POA: Diagnosis not present

## 2018-11-18 DIAGNOSIS — I25111 Atherosclerotic heart disease of native coronary artery with angina pectoris with documented spasm: Secondary | ICD-10-CM

## 2018-11-18 DIAGNOSIS — M25559 Pain in unspecified hip: Secondary | ICD-10-CM

## 2018-11-18 DIAGNOSIS — D751 Secondary polycythemia: Secondary | ICD-10-CM | POA: Insufficient documentation

## 2018-11-18 DIAGNOSIS — G4719 Other hypersomnia: Secondary | ICD-10-CM | POA: Diagnosis not present

## 2018-11-18 DIAGNOSIS — Z95820 Peripheral vascular angioplasty status with implants and grafts: Secondary | ICD-10-CM | POA: Diagnosis not present

## 2018-11-18 DIAGNOSIS — R0683 Snoring: Secondary | ICD-10-CM | POA: Insufficient documentation

## 2018-11-18 NOTE — Progress Notes (Addendum)
SLEEP MEDICINE CLINIC    Provider:  Larey Seat, MD  Primary Care Physician:  Prince Solian, [place] [address]     Referring Provider: Prince Solian, [place],  [place] [address]          Chief Complaint according to patient   Patient presents with:    . New Patient (Initial Visit)     complains of inconsistant sleep patterns that are leading to fatigue during the day. never had a sleep study      HISTORY OF PRESENT ILLNESS:  Vincent Mendoza is a 63 y.o. year old Caucasian male patient seen  on 11/18/2018 upon referral from Dr. Dagmar Hait  for a sleep consultation .  Chief concern according to patient : "complains of inconsistant sleep patterns that began years ago-these are leading to more fatigue during the day. he never had a sleep study. Difficulties maintaining sleep and he feels quality of sleep has declined."    I have the pleasure of seeing Vincent Mendoza today, a right-handed Caucasian male with a possible sleep disorder.  She has a  has a past medical history of Diverticulosis, Gout, Hyperlipidemia, Hypertension, Inguinal hernia, Kidney stone, Morton's neuroma of left foot, and Wears glasses.. He has a history of CAD and angioplasty with stent placement, in Feb 2020. On beta blocker. He has higher erythrocyte counts and therefor needs checking for hypoxia. Gout is well controlled.    Sleep relevant medical history: No nocturia, no  Parasomnia , he had a Tonsillectomy-no cervical spine surgery/trauma, no deviated septum.   Family medical /sleep history: Father is DM, Obese, and on CPAP for OSA.    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  He lives in a household with  Spouse- Family status is married , with 2 adult children. The patient currently works day time. 7 Am to 7 Pm - non stop , 4 days a week. 55 work hours.  Pets are present. 2 dogs.   Tobacco use: none.  ETOH use; some - per week  2 drinks or less.  Caffeine intake in form of Coffee( 1 cup) Soda( none) Tea ( none), nor energy drinks. Regular exercise none .   Hobbies :none  Sleep habits are as follows: The patient's dinner time is between 7 PM. The patient goes to bed at 9 PM and continues to sleep for 2-3 hours en-bloc , wakes for unknown reasons- no trigger. The preferred sleep position is sideways, left, with the support of 1 pillow. Flat bed. Dreams are reportedly rare.  5.30  AM is the usual rise time. The patient wakes up spontaneously. TST may be 7 hours or less.  He  reports not feeling refreshed or restored in AM, without  dry mouth , no morning headaches but  residual fatigue. Naps are taken in frequently, just too busy.  If he rests he sleeps-    Review of Systems: Out of a complete 14 system review, the patient complains of only the following symptoms, and all other reviewed systems are negative.:  Fatigue, sleepiness , snoring, fragmented sleep, Insomnia =   How likely are you to doze in the following situations: 0 = not likely, 1 = slight chance, 2 = moderate chance, 3 = high chance   Sitting and Reading? Watching Television? Sitting inactive in a public place (theater or meeting)? As a passenger in  a car for an hour without a break? Lying down in the afternoon when circumstances permit? Sitting and talking to someone? Sitting quietly after lunch without alcohol? In a car, while stopped for a few minutes in traffic?   Total = 14/ 24 points   FSS endorsed at 20/ 63 points.   Social History   Socioeconomic History  . Marital status: Married    Spouse name: Not on file  . Number of children: Not on file  . Years of education: Not on file  . Highest education level: Not on file  Occupational History  . Not on file  Social Needs  . Financial resource strain: Not on file  . Food insecurity    Worry: Not on file    Inability: Not on file  .  Transportation needs    Medical: Not on file    Non-medical: Not on file  Tobacco Use  . Smoking status: Never Smoker  . Smokeless tobacco: Never Used  Substance and Sexual Activity  . Alcohol use: Yes    Alcohol/week: 7.0 standard drinks    Types: 7 Standard drinks or equivalent per week    Comment: occassional   . Drug use: No  . Sexual activity: Not on file  Lifestyle  . Physical activity    Days per week: Not on file    Minutes per session: Not on file  . Stress: Not on file  Relationships  . Social Herbalist on phone: Not on file    Gets together: Not on file    Attends religious service: Not on file    Active member of club or organization: Not on file    Attends meetings of clubs or organizations: Not on file    Relationship status: Not on file  Other Topics Concern  . Not on file  Social History Narrative  . Not on file    Family History  Problem Relation Age of Onset  . Heart disease Mother   . Heart disease Father   . Diabetes Mellitus II Father   . Pancreatic cancer Maternal Aunt   . Heart disease Maternal Grandfather   . Heart disease Paternal Grandfather   . Colon cancer Neg Hx     Past Medical History:  Diagnosis Date  . Diverticulosis   . Gout   . Hyperlipidemia   . Hypertension    stopped taking Lisinopril after a week  . Inguinal hernia    right  . Kidney stone   . Morton's neuroma of left foot   . Wears glasses     Past Surgical History:  Procedure Laterality Date  . COLONOSCOPY    . EXCISION MORTON'S NEUROMA Left 03/20/2017   Procedure: Left 3-4 Morton's Neuroma Excision;  Surgeon: Wylene Simmer, MD;  Location: [facility];  Service: Orthopedics;  Laterality: Left;  . INGUINAL HERNIA REPAIR Bilateral 12/29/2014   Procedure: LAPAROSCOPIC BILATERAL INGUINAL HERNIA REPAIR;  Surgeon: Ralene Ok, MD;  Location: [facility];  Service: General;  Laterality: Bilateral;  . INSERTION OF MESH Right 12/29/2014   Procedure:  INSERTION OF MESH;  Surgeon: Ralene Ok, MD;  Location: [facility];  Service: General;  Laterality: Right;  . LAPAROSCOPY N/A 12/29/2014   Procedure: LAPAROSCOPY DIAGNOSTIC;  Surgeon: Ralene Ok, MD;  Location: [facility];  Service: General;  Laterality: N/A;  . LITHOTRIPSY     x3  . LIVER BIOPSY N/A 12/29/2014   Procedure: LIVER BIOPSY;  Surgeon: Ralene Ok, MD;  [facility];  Service: General;  Laterality: N/A;  . TONSILLECTOMY    . VASECTOMY       Current Outpatient Medications on File Prior to Visit  Medication Sig Dispense Refill  . allopurinol (ZYLOPRIM) 100 MG tablet Take 100 mg by mouth daily.    Marland Kitchen aspirin EC 81 MG tablet Take 81 mg by mouth daily.    . clopidogrel (PLAVIX) 75 MG tablet Take 75 mg by mouth daily.    Vanessa Kick Ethyl (VASCEPA) 1 g CAPS Take 2 capsules (2 g total) by mouth 2 (two) times daily. 120 capsule 11  . losartan-hydrochlorothiazide (HYZAAR) 100-12.5 MG tablet Take 1 tablet by mouth daily.    . metoprolol tartrate (LOPRESSOR) 25 MG tablet Take 25 mg by mouth 2 (two) times daily.    . nitroGLYCERIN (NITROSTAT) 0.4 MG SL tablet Place 0.4 mg under the tongue every 5 (five) minutes as needed for chest pain.    . potassium citrate (UROCIT-K) 10 MEQ (1080 MG) SR tablet Take 10 mEq by mouth 2 (two) times daily.     . rosuvastatin (CRESTOR) 20 MG tablet Take 20 mg by mouth daily.     No current facility-administered medications on file prior to visit.     Allergies  Allergen Reactions  . Lisinopril Cough    Physical exam:  Today's Vitals   11/18/18 0835  BP: 118/82  Pulse: (!) 51  Temp: (!) 97.3 F (36.3 C)  Weight: 189 lb (85.7 kg)  Height: 5\' 10"  (1.778 m)   Body mass index is 27.12 kg/m.   Wt Readings from Last 3 Encounters:  11/18/18 189 lb (85.7 kg)  04/29/18 190 lb 6.4 oz (86.4 kg)  03/20/17 191 lb 12.8 oz (87 kg)     Ht Readings from Last 3 Encounters:  11/18/18 5\' 10"  (1.778 m)  04/29/18 5\' 10"  (1.778 m)  03/20/17 5\' 9"   (1.753 m)      General: The patient is awake, alert and appears not in acute distress. The patient is well groomed. Head: Normocephalic, atraumatic. Neck is supple. Mallampati 3- but lateral restriction. ,  neck circumference:16 inches . Nasal airflow  patent.  Retrognathia is seen.  Dental status: intact Cardiovascular:  Regular rate and cardiac rhythm by pulse,  without distended neck veins. Respiratory: Lungs are clear to auscultation.  Skin:  Without evidence of ankle edema, or rash. Trunk: The patient's posture is erect.   Neurologic exam : The patient is awake and alert, oriented to place and time.   Memory subjective described as intact.  Attention span & concentration ability appears normal.  Speech is fluent,  without  dysarthria, dysphonia or aphasia.  Mood and affect are appropriate.   Cranial nerves: no loss of smell or taste reported  Pupils are equal and briskly reactive to light. Funduscopic exam deferred.  Extraocular movements in vertical and horizontal planes were intact and without nystagmus. No Diplopia. Visual fields by finger perimetry are intact. Hearing was intact to soft voice and finger rubbing.    Facial sensation intact to fine touch.  Facial motor strength is symmetric and tongue and uvula move midline.  Neck ROM : rotation, tilt and flexion extension were normal for age and shoulder shrug was symmetrical.    Motor exam:  Symmetric bulk, tone and ROM.   Normal tone without cog wheeling, symmetric grip strength .   Sensory:  Fine touch, pinprick and vibration were tested  and  normal.  Proprioception tested in the upper  extremities was normal.   Coordination: Rapid alternating movements in the fingers/hands were of normal speed.  The Finger-to-nose maneuver was deferred.   Gait and station: deferred..  Toe and heel walk were deferred.  Deep tendon reflexes: in the  upper and lower extremities are symmetric and intact.  Babinski response was deferred.         After spending a total time of  35  minutes face to face and additional time for physical and neurologic examination, review of laboratory studies,  personal review of imaging studies, reports and results of other testing and review of referral information / records as far as provided in visit, I have established the following assessments:  Non restorative sleep.  1)  Higher degree of sleepiness in daytime , attributed to fragmented sleep. No pain, no bad dreams, but snoring.  2) sleep initiation is not a problem, sustaining sleep is a problem.    My Plan is to proceed with:  1)I would like to obtain an attended sleep study to address the type of arousals and out of which sleep stage these occur.     I would like to thank Avva, Ravisankar, MD and for allowing me to meet with and to take care of this pleasant patient.   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   CC: I will share my notes with PCP/ cardiology  Electronically signed by: Larey Seat, MD 11/18/2018 9:16 AM  [facility] and [facility] Sleep Board certified by The [employer] of Sleep Medicine and Diplomate of the [employer] of Sleep Medicine. Board certified In Neurology through the [place], Fellow of the [employer] of Neurology. Medical Director of [employer].

## 2018-12-15 ENCOUNTER — Ambulatory Visit (INDEPENDENT_AMBULATORY_CARE_PROVIDER_SITE_OTHER): Payer: BC Managed Care – PPO | Admitting: Neurology

## 2018-12-15 DIAGNOSIS — D751 Secondary polycythemia: Secondary | ICD-10-CM

## 2018-12-15 DIAGNOSIS — G478 Other sleep disorders: Secondary | ICD-10-CM | POA: Diagnosis not present

## 2018-12-15 DIAGNOSIS — I25111 Atherosclerotic heart disease of native coronary artery with angina pectoris with documented spasm: Secondary | ICD-10-CM

## 2018-12-15 DIAGNOSIS — Z95820 Peripheral vascular angioplasty status with implants and grafts: Secondary | ICD-10-CM

## 2018-12-15 DIAGNOSIS — R0683 Snoring: Secondary | ICD-10-CM

## 2018-12-15 DIAGNOSIS — G4719 Other hypersomnia: Secondary | ICD-10-CM

## 2018-12-16 ENCOUNTER — Ambulatory Visit: Payer: BC Managed Care – PPO | Admitting: Internal Medicine

## 2018-12-16 ENCOUNTER — Encounter: Payer: Self-pay | Admitting: Internal Medicine

## 2018-12-16 ENCOUNTER — Other Ambulatory Visit: Payer: Self-pay

## 2018-12-16 VITALS — BP 122/79 | HR 51 | Ht 70.0 in | Wt 190.0 lb

## 2018-12-16 DIAGNOSIS — I10 Essential (primary) hypertension: Secondary | ICD-10-CM | POA: Diagnosis not present

## 2018-12-16 DIAGNOSIS — I214 Non-ST elevation (NSTEMI) myocardial infarction: Secondary | ICD-10-CM | POA: Diagnosis not present

## 2018-12-16 DIAGNOSIS — Z9861 Coronary angioplasty status: Secondary | ICD-10-CM | POA: Diagnosis not present

## 2018-12-16 DIAGNOSIS — I2510 Atherosclerotic heart disease of native coronary artery without angina pectoris: Secondary | ICD-10-CM

## 2018-12-16 DIAGNOSIS — E781 Pure hyperglyceridemia: Secondary | ICD-10-CM

## 2018-12-16 NOTE — Progress Notes (Signed)
OFFICE CONSULT NOTE  Chief Complaint:  Follow-up NSTEMI  Primary Care Physician: Prince Solian, MD  HPI:  Vincent Mendoza is a 63 y.o. male who is being seen today for the evaluation of recent NSTEMI at the request of Avva, Ravisankar, MD. 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  According to his records, he presented at [facility] after awakening from his dead sleep at 3 AM.  He had left-sided shoulder pain radiating anteriorly of the mid chest and neck.  He said it lasted for more than 10 minutes was noted to be about a 4 out of 10 chest pain.  He presented to the emergency department and initial troponin was elevated 0.1.  Subsequent troponins were 0.35 and 0.63.  He was diagnosed with a non-STEMI and treated appropriately.  He did undergo left heart catheterization on 04/20/2018 at [facility].  This demonstrated an 80% proximal LAD stenosis and a 40% distal LAD stenosis.  There was a 50% mid RCA lesion as well.  Subsequently he underwent balloon angioplasty and placement of a 2.5 x 18 mm Onyx resolute drug-eluting stent.  This was postdilated with a 3.0 x 15 mm Quantum apex balloon.  TIMI-3 flow was noted.  He had an echocardiogram as well which showed normal systolic function, mild LVH, very mild hypokinesis of the basal anterior mid anterior and apical anterior walls.  Grade 1 diastolic dysfunction was noted with mild aortic insufficiency.  Since stenting he is done well.  He is got some occasional sharp chest discomfort but no further anginal symptoms.  He has started walking more regularly and says he is committed to changing his dietary habits.  Although he has a history of dyslipidemia he had been on high potency statin therapy with Crestor 20 mg daily.  During a recent physical with his PCP, his labs indicated total cholesterol 158, HDL 50, LDL 79 and triglycerides 147.  He had repeat labs during his hospitalization which showed total  cholesterol of 130, triglycerides 197, HDL 42 and LDL of 49.  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  Overall he says he is doing well.  He is doing home rehabilitation.  Occasionally gets some pain in his left shoulder area which is reminiscent of his anginal symptoms when he starts exercising however does dissipate with more exercise.  He denies any worsening shortness of breath in fact his exercise is getting a little easier.  Recently though he has not been exercising as much due to stress at work.  He hopes to get back to that.  He was successfully started on Vascepa.  We have not yet had repeat lipids.  He plans to see his primary care provider in December will have lab work at that time which I will review.  Most likely we could consider coming off of dual antiplatelet therapy next February.  PMHx:  Past Medical History:  Diagnosis Date  . Diverticulosis   . Gout   . Hyperlipidemia   . Hypertension    stopped taking Lisinopril after a week  . Inguinal hernia    right  . Kidney stone   . Morton's neuroma of left foot   . Wears glasses     Past Surgical History:  Procedure Laterality Date  . COLONOSCOPY    . EXCISION MORTON'S NEUROMA Left 03/20/2017   Procedure: Left 3-4 Morton's Neuroma Excision;  Surgeon: Wylene Simmer, MD;  Location: [facility];  Service: Orthopedics;  Laterality: Left;  . INGUINAL HERNIA REPAIR Bilateral 12/29/2014   Procedure: LAPAROSCOPIC BILATERAL INGUINAL HERNIA REPAIR;  Surgeon: Ralene Ok, MD;  Location: [facility];  Service: General;  Laterality: Bilateral;  . INSERTION OF MESH Right 12/29/2014   Procedure: INSERTION OF MESH;  Surgeon: Ralene Ok, MD;  Location: [facility];  Service: General;  Laterality: Right;  . LAPAROSCOPY N/A 12/29/2014   Procedure: LAPAROSCOPY DIAGNOSTIC;  Surgeon: Ralene Ok, MD;  Location: [facility];  Service: General;  Laterality: N/A;  . LITHOTRIPSY     x3  . LIVER BIOPSY N/A 12/29/2014    Procedure: LIVER BIOPSY;  Surgeon: Ralene Ok, MD;  Location: [facility];  Service: General;  Laterality: N/A;  . TONSILLECTOMY    . VASECTOMY      FAMHx:  Family History  Problem Relation Age of Onset  . Heart disease Mother   . Heart disease Father   . Diabetes Mellitus II Father   . Pancreatic cancer Maternal Aunt   . Heart disease Maternal Grandfather   . Heart disease Paternal Grandfather   . Colon cancer Neg Hx     SOCHx:   reports that he has never smoked. He has never used smokeless tobacco. He reports current alcohol use of about 7.0 standard drinks of alcohol per week. He reports that he does not use drugs.  ALLERGIES:  Allergies  Allergen Reactions  . Lisinopril Cough    ROS: Pertinent items noted in HPI and remainder of comprehensive ROS otherwise negative.  HOME MEDS: Current Outpatient Medications on File Prior to Visit  Medication Sig Dispense Refill  . allopurinol (ZYLOPRIM) 100 MG tablet Take 100 mg by mouth daily.    Marland Kitchen aspirin EC 81 MG tablet Take 81 mg by mouth daily.    . clopidogrel (PLAVIX) 75 MG tablet Take 75 mg by mouth daily.    . hydrochlorothiazide (MICROZIDE) 12.5 MG capsule Take 12.5 mg by mouth daily.    Vanessa Kick Ethyl (VASCEPA) 1 g CAPS Take 2 capsules (2 g total) by mouth 2 (two) times daily. 120 capsule 11  . losartan (COZAAR) 100 MG tablet Take 100 mg by mouth daily.    . metoprolol tartrate (LOPRESSOR) 25 MG tablet Take 25 mg by mouth 2 (two) times daily.    . nitroGLYCERIN (NITROSTAT) 0.4 MG SL tablet Place 0.4 mg under the tongue every 5 (five) minutes as needed for chest pain.    . potassium citrate (UROCIT-K) 10 MEQ (1080 MG) SR tablet Take 10 mEq by mouth 2 (two) times daily.     . rosuvastatin (CRESTOR) 20 MG tablet Take 20 mg by mouth daily.     No current facility-administered medications on file prior to visit.     LABS/IMAGING: No results found for this or any previous visit (from the past 48 hour(s)). No results  found.  LIPID PANEL: No results found for: CHOL, TRIG, HDL, CHOLHDL, VLDL, LDLCALC, LDLDIRECT  WEIGHTS: Wt Readings from Last 3 Encounters:  12/16/18 190 lb (86.2 kg)  11/18/18 189 lb (85.7 kg)  04/29/18 190 lb 6.4 oz (86.4 kg)    VITALS: BP 122/79   Pulse (!) 51   Ht 5\' 10"  (1.778 m)   Wt 190 lb (86.2 kg)   SpO2 98%   BMI 27.26 kg/m   EXAM: General appearance: alert and no distress Neck: no carotid bruit, no JVD and thyroid not enlarged, symmetric, no tenderness/mass/nodules Lungs: clear to auscultation bilaterally Heart: regular rate and rhythm, S1, S2  normal and diastolic murmur: early diastolic 2/6, blowing at 2nd right intercostal space Abdomen: soft, non-tender; bowel sounds normal; no masses,  no organomegaly Extremities: extremities normal, atraumatic, no cyanosis or edema Pulses: 2+ and symmetric Skin: Skin color, texture, turgor normal. No rashes or lesions Neurologic: Grossly normal Psych: Pleasant  EKG: Sinus bradycardia 51-personally reviewed  ASSESSMENT: 1. NSTEMI 2. CAD status post DES to the proximal LAD with residual 50% mid RCA disease and distal LAD disease (04/20/2018) 3. Dyslipidemia 4. Hypertension 5. Family history of coronary disease in both parents  PLAN: 1.   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  He does get a little bit of left shoulder pain with exercise but that improves.  He has been compliant with his statin and Vascepa.  I am interested to see what his lipid numbers show when he has a repeat test in December.  Will plan to continue dual antiplatelet therapy at least until February 2021.  Follow-up with me at that time.  Pixie Casino, MD, [facility], [place] Director of the Advanced Lipid Disorders &  Cardiovascular Risk Reduction Clinic Diplomate of the American Board of Clinical Lipidology Attending Cardiologist  Direct Dial: [phone]  Fax: [phone]   Website:  [email or web address] 12/16/2018, 9:06 AM

## 2018-12-16 NOTE — Patient Instructions (Addendum)
Dr Debara Pickett recommends that you schedule a follow-up appointment in 5 months. You will receive a reminder letter in the mail two months in advance. If you don't receive a letter, please call our office to schedule the follow-up appointment.

## 2018-12-19 NOTE — Procedures (Signed)
PATIENT'S NAME:  [name] DOB:      June 09, 1955      MR#:    [id number]     DATE OF RECORDING: 12/15/2018  AL REFERRING M.D.:  Vincent Solian MD Study Performed:   Baseline Polysomnogram HISTORY:  This patient was seen on 11/18/2018 upon referral from Dr. Dagmar Mendoza for a sleep consultation.  Chief concern according to patient: "concerns of inconsistent sleep patterns that began years ago- These are leading to more fatigue during the day. Difficulties maintaining sleep and quality of sleep has declined."  The patient reports hypersomnia, and will easily fall asleep when not stimulated or physically active.    I have the pleasure of seeing [name] today, a right-handed Caucasian 63- year- old male with a medical history of: Diverticulosis, Gout, Hyperlipidemia, Hypertension, Inguinal hernia, Kidney stone, Morton's neuroma of left foot, and has a history of CAD and angioplasty with stent placement, in Feb 2020. On beta blocker. He has higher erythrocyte counts and therefor needs checking for hypoxia. Gout is reportedly well controlled.    Sleep relevant medical history: No nocturia, nor Parasomnia reported, he had a Tonsillectomy- no cervical spine surgery/trauma, no deviated septum. Family sleep history: Father has DM 2, Obesity, and is on CPAP therapy for OSA.    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  He lives in a household with his spouse- The patient currently works daytime. 7 Am to 7 Pm - non- stop , 4 days a week. 55 work hours.  The patient goes to bed at 9 PM and continues to sleep for 2-3 hours en-bloc , wakes for unknown reasons- no trigger. The preferred sleep position is sideways, left, with the support of 1 pillow. Flat bed. Dreams are reportedly rare.  5.30 AM is the usual rise time. The patient wakes up spontaneously. TST may be 7 hours or less.  He reports not feeling refreshed  or restored in AM but doesn't nap.   The patient endorsed the Epworth Sleepiness Scale at 14 points.   The patient's weight 190 pounds with a height of 70 (inches), resulting in a BMI of 27.1 kg/m2. The patient's neck circumference measured 16 inches.  CURRENT MEDICATIONS: Zyloprim, Aspirin, Plavix, Vascepa, Lopressor, Nitrostat, Crestor   PROCEDURE:  This is a multichannel digital polysomnogram utilizing the Somnostar 11.2 system.  Electrodes and sensors were applied and monitored per AASM Specifications.   EEG, EOG, Chin and Limb EMG, were sampled at 200 Hz.  ECG, Snore and Nasal Pressure, Thermal Airflow, Respiratory Effort, CPAP Flow and Pressure, Oximetry was sampled at 50 Hz. Digital video and audio were recorded.      BASELINE STUDY: Lights Out was at 22:19 and Lights On at 04:59.  Total recording time (TRT) was 401 minutes, with a total sleep time (TST) of 314.5 minutes.   The patient's sleep latency was 19 minutes.  REM latency was 84.5 minutes.  The sleep efficiency was 78.4 %.     SLEEP ARCHITECTURE: WASO (Wake after sleep onset) was 79 minutes. There were 34 minutes in Stage N1, 76.5 minutes Stage N2, 127 minutes Stage N3 and 77 minutes in Stage REM.  The percentage of Stage N1 was 10.8%, Stage N2 was 24.3%, Stage N3 was 40.4% and Stage R (REM sleep) was 24.5%.   RESPIRATORY ANALYSIS:  There were a total of 42 respiratory events:  1 obstructive apnea, 0 central  apneas and 1 mixed apnea with 40 hypopneas. The patient also had 0 respiratory event related arousals (RERAs).      The total APNEA/HYPOPNEA INDEX (AHI) was 8.0 /hour.  25 events occurred in REM sleep and 31 events in NREM. The REM AHI was 19.5 /hour, versus a non-REM AHI of 4.3. The patient spent 102 minutes of total sleep time in the supine position and 213 minutes in non-supine. The supine AHI was 22.4/h versus a non-supine AHI of 1.1.  The Wake baseline 02 saturation was 98%, with the nadir being 80%. Time spent below 89%  saturation equaled 15 minutes. Most hypoxemia clustered in REM sleep and in supine position.  The patient had a total of 0 Periodic Limb Movements.  The arousals were noted as: 82 were spontaneous, 0 were associated with PLMs, and 18 were associated with respiratory events. Audio and video analysis did not show any abnormal or unusual movements, behaviors, phonations or vocalizations.  No bathroom breaks. Snoring was noted during supine sleep.  EKG was in bradycardic sinus rhythm (NSR).  IMPRESSION:  1. Mild Obstructive Sleep Apnea (OSA), or rather Sleep Hypopnea with an overall AHI of 8/h - clearly REM sleep (AHI of 19) and supine sleep dependent (AHI 22/h). 2. Fragmented sleep - some arousals were scored as spontaneous but seem to follow minor desaturations around 3% SpO2. The AASM guidelines score hypoxemia after a 4% drop in O2, so I want to explain that these "episodes" still contributed to arousals. Some prolonged arousals followed REM sleep periods.    RECOMMENDATIONS: Higher erythrocytes can well be related to these 3% desaturation in oxygen.  1. I believe a CPAP titration study can help to reduce sleep fragmentation and arousals, will help to optimize therapy. If permitted by insurance I will order a in lab titration, if denied will use auto CPAP>    I certify that I have reviewed the entire raw data recording prior to the issuance of this report in accordance with the Standards of Accreditation of the American Academy of Sleep Medicine (AASM)    Larey Seat, MD    12-17-2018 Diplomat, American Board of Psychiatry and Neurology  Diplomat, American Board of [place] Director, [place] [facility] [employer]

## 2018-12-19 NOTE — Addendum Note (Signed)
Addended by: Larey Seat on: 12/19/2018 01:43 PM   Modules accepted: Orders

## 2018-12-22 ENCOUNTER — Telehealth: Payer: Self-pay | Admitting: Neurology

## 2018-12-22 NOTE — Telephone Encounter (Signed)
Called patient to discuss sleep study results. No answer at this time. LVM for the patient to call back.   

## 2018-12-22 NOTE — Telephone Encounter (Signed)
-----   Message from Larey Seat, MD sent at 12/19/2018  1:43 PM EDT ----- EKG was in bradycardic sinus rhythm (NSR).  IMPRESSION:  1. Mild Obstructive Sleep Apnea (OSA), or rather Sleep Hypopnea with an overall AHI of 8/h - clearly REM sleep (AHI of 19) and supine sleep dependent (AHI 22/h). 2. Fragmented sleep - some arousals were scored as spontaneous but seem to follow minor desaturations around 3% SpO2.   PS: The AASM guidelines score hypoxemia after a 4% drop in O2, so I want to explain that these "episodes" still contributed to arousals. Some prolonged arousals followed REM sleep periods.    RECOMMENDATIONS: Higher erythrocytes can well be related to these 3% desaturation in oxygen.  1. I believe a CPAP titration study can help to reduce sleep fragmentation and arousals, will help to optimize therapy. If permitted by insurance I will order a in lab titration, if denied will use auto CPAP

## 2018-12-25 NOTE — Telephone Encounter (Signed)
Pt has returned the call to RN Myriam Jacobson, please call

## 2018-12-28 NOTE — Telephone Encounter (Signed)
Called patien back to discuss sleep study results. No answer at this time. LVM for the patient to call back. 2nd attempt.

## 2018-12-30 NOTE — Telephone Encounter (Signed)
Pt returning call please call back °

## 2018-12-30 NOTE — Telephone Encounter (Signed)
Called the patient back and I was able to review the sleep study with him. I advised pt that Dr. Brett Fairy reviewed their sleep study results and found that mild sleep apnea and recommends that pt be treated with a cpap. Dr. Brett Fairy recommends that pt return for a repeat sleep study in order to properly titrate the cpap and ensure a good mask fit. Pt is agreeable to returning for a titration study. I advised pt that our sleep lab will file with pt's insurance and call pt to schedule the sleep study when we hear back from the pt's insurance regarding coverage of this sleep study. Pt verbalized understanding of results. Pt had no questions at this time but was encouraged to call back if questions arise.

## 2019-05-19 ENCOUNTER — Ambulatory Visit: Payer: BC Managed Care – PPO | Admitting: Internal Medicine

## 2019-05-19 ENCOUNTER — Encounter (INDEPENDENT_AMBULATORY_CARE_PROVIDER_SITE_OTHER): Payer: Self-pay

## 2019-05-19 ENCOUNTER — Encounter: Payer: Self-pay | Admitting: Internal Medicine

## 2019-05-19 ENCOUNTER — Other Ambulatory Visit: Payer: Self-pay

## 2019-05-19 VITALS — BP 110/73 | HR 53 | Temp 97.0°F | Ht 70.0 in | Wt 191.2 lb

## 2019-05-19 DIAGNOSIS — I2510 Atherosclerotic heart disease of native coronary artery without angina pectoris: Secondary | ICD-10-CM

## 2019-05-19 DIAGNOSIS — N528 Other male erectile dysfunction: Secondary | ICD-10-CM

## 2019-05-19 DIAGNOSIS — Z9861 Coronary angioplasty status: Secondary | ICD-10-CM

## 2019-05-19 DIAGNOSIS — I10 Essential (primary) hypertension: Secondary | ICD-10-CM

## 2019-05-19 DIAGNOSIS — E781 Pure hyperglyceridemia: Secondary | ICD-10-CM | POA: Diagnosis not present

## 2019-05-19 NOTE — Progress Notes (Signed)
OFFICE CONSULT NOTE  Chief Complaint:  Follow-up NSTEMI  Primary Care Physician: Prince Solian, MD  HPI:  Vincent Mendoza is a 64 y.o. male who is being seen today for the evaluation of recent NSTEMI at the request of Avva, Ravisankar, MD. 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  According to his records, he presented at [facility] after awakening from his dead sleep at 3 AM.  He had left-sided shoulder pain radiating anteriorly of the mid chest and neck.  He said it lasted for more than 10 minutes was noted to be about a 4 out of 10 chest pain.  He presented to the emergency department and initial troponin was elevated 0.1.  Subsequent troponins were 0.35 and 0.63.  He was diagnosed with a non-STEMI and treated appropriately.  He did undergo left heart catheterization on 04/20/2018 at [facility].  This demonstrated an 80% proximal LAD stenosis and a 40% distal LAD stenosis.  There was a 50% mid RCA lesion as well.  Subsequently he underwent balloon angioplasty and placement of a 2.5 x 18 mm Onyx resolute drug-eluting stent.  This was postdilated with a 3.0 x 15 mm Quantum apex balloon.  TIMI-3 flow was noted.  He had an echocardiogram as well which showed normal systolic function, mild LVH, very mild hypokinesis of the basal anterior mid anterior and apical anterior walls.  Grade 1 diastolic dysfunction was noted with mild aortic insufficiency.  Since stenting he is done well.  He is got some occasional sharp chest discomfort but no further anginal symptoms.  He has started walking more regularly and says he is committed to changing his dietary habits.  Although he has a history of dyslipidemia he had been on high potency statin therapy with Crestor 20 mg daily.  During a recent physical with his PCP, his labs indicated total cholesterol 158, HDL 50, LDL 79 and triglycerides 147.  He had repeat labs during his hospitalization which showed total  cholesterol of 130, triglycerides 197, HDL 42 and LDL of 49.  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  Overall he says he is doing well.  He is doing home rehabilitation.  Occasionally gets some pain in his left shoulder area which is reminiscent of his anginal symptoms when he starts exercising however does dissipate with more exercise.  He denies any worsening shortness of breath in fact his exercise is getting a little easier.  Recently though he has not been exercising as much due to stress at work.  He hopes to get back to that.  He was successfully started on Vascepa.  We have not yet had repeat lipids.  He plans to see his primary care provider in December will have lab work at that time which I will review.  Most likely we could consider coming off of dual antiplatelet therapy next February.  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 Overall he is doing very well. He denies chest pain or shortness of breath. He is 1 year post-PCI. At this point, I feel we can discontinue his Plavix. His only real complaint is ED - he wonders if the metoprolol may be contributing to that. It is possible, but in my experience of using it frequently over many years, it is less likely.  He did bring this up to his urologist who had prescribed him Viagra but he has not used it consistently.  I feel great importance of  using beta-blockers in patients with non-STEMI and coronary artery disease given multiple cardiovascular benefits.  He was advised not to use nitrates and Viagra concomitantly.  PMHx:  Past Medical History:  Diagnosis Date  . Diverticulosis   . Gout   . Hyperlipidemia   . Hypertension    stopped taking Lisinopril after a week  . Inguinal hernia    right  . Kidney stone   . Morton's neuroma of left foot   . Wears glasses     Past Surgical History:  Procedure Laterality Date  . COLONOSCOPY    . EXCISION MORTON'S NEUROMA Left 03/20/2017   Procedure: Left 3-4 Morton's  Neuroma Excision;  Surgeon: Wylene Simmer, MD;  Location: [facility];  Service: Orthopedics;  Laterality: Left;  . INGUINAL HERNIA REPAIR Bilateral 12/29/2014   Procedure: LAPAROSCOPIC BILATERAL INGUINAL HERNIA REPAIR;  Surgeon: Ralene Ok, MD;  Location: [facility];  Service: General;  Laterality: Bilateral;  . INSERTION OF MESH Right 12/29/2014   Procedure: INSERTION OF MESH;  Surgeon: Ralene Ok, MD;  Location: [facility];  Service: General;  Laterality: Right;  . LAPAROSCOPY N/A 12/29/2014   Procedure: LAPAROSCOPY DIAGNOSTIC;  Surgeon: Ralene Ok, MD;  Location: [facility];  Service: General;  Laterality: N/A;  . LITHOTRIPSY     x3  . LIVER BIOPSY N/A 12/29/2014   Procedure: LIVER BIOPSY;  Surgeon: Ralene Ok, MD;  Location: [facility];  Service: General;  Laterality: N/A;  . TONSILLECTOMY    . VASECTOMY      FAMHx:  Family History  Problem Relation Age of Onset  . Heart disease Mother   . Heart disease Father   . Diabetes Mellitus II Father   . Pancreatic cancer Maternal Aunt   . Heart disease Maternal Grandfather   . Heart disease Paternal Grandfather   . Colon cancer Neg Hx     SOCHx:   reports that he has never smoked. He has never used smokeless tobacco. He reports current alcohol use of about 7.0 standard drinks of alcohol per week. He reports that he does not use drugs.  ALLERGIES:  Allergies  Allergen Reactions  . Lisinopril Cough    ROS: Pertinent items noted in HPI and remainder of comprehensive ROS otherwise negative.  HOME MEDS: Current Outpatient Medications on File Prior to Visit  Medication Sig Dispense Refill  . allopurinol (ZYLOPRIM) 100 MG tablet Take 100 mg by mouth daily.    Marland Kitchen aspirin EC 81 MG tablet Take 81 mg by mouth daily.    . clopidogrel (PLAVIX) 75 MG tablet Take 75 mg by mouth daily.    . hydrochlorothiazide (MICROZIDE) 12.5 MG capsule Take 12.5 mg by mouth daily.    Vanessa Kick Ethyl (VASCEPA) 1 g CAPS Take 2 capsules (2 g  total) by mouth 2 (two) times daily. 120 capsule 11  . losartan (COZAAR) 100 MG tablet Take 100 mg by mouth daily.    . metoprolol tartrate (LOPRESSOR) 25 MG tablet Take 25 mg by mouth 2 (two) times daily.    . nitroGLYCERIN (NITROSTAT) 0.4 MG SL tablet Place 0.4 mg under the tongue every 5 (five) minutes as needed for chest pain.    . potassium citrate (UROCIT-K) 10 MEQ (1080 MG) SR tablet Take 10 mEq by mouth 2 (two) times daily.     . rosuvastatin (CRESTOR) 20 MG tablet Take 20 mg by mouth daily.     No current facility-administered medications on file prior to visit.    LABS/IMAGING: No results  found for this or any previous visit (from the past 48 hour(s)). No results found.  LIPID PANEL: No results found for: CHOL, TRIG, HDL, CHOLHDL, VLDL, LDLCALC, LDLDIRECT  WEIGHTS: Wt Readings from Last 3 Encounters:  05/19/19 191 lb 3.2 oz (86.7 kg)  12/16/18 190 lb (86.2 kg)  11/18/18 189 lb (85.7 kg)    VITALS: BP 110/73   Pulse (!) 53   Temp (!) 97 F (36.1 C)   Ht 5\' 10"  (1.778 m)   Wt 191 lb 3.2 oz (86.7 kg)   SpO2 97%   BMI 27.43 kg/m   EXAM: General appearance: alert and no distress Neck: no carotid bruit, no JVD and thyroid not enlarged, symmetric, no tenderness/mass/nodules Lungs: clear to auscultation bilaterally Heart: regular rate and rhythm, S1, S2 normal and diastolic murmur: early diastolic 2/6, blowing at 2nd right intercostal space Abdomen: soft, non-tender; bowel sounds normal; no masses,  no organomegaly Extremities: extremities normal, atraumatic, no cyanosis or edema Pulses: 2+ and symmetric Skin: Skin color, texture, turgor normal. No rashes or lesions Neurologic: Grossly normal Psych: Pleasant  EKG: Sinus bradycardia 53-personally reviewed  ASSESSMENT: 1. NSTEMI 2. CAD status post DES to the proximal LAD with residual 50% mid RCA disease and distal LAD disease (04/20/2018) 3. Dyslipidemia 4. Hypertension 5. Family history of coronary disease in  both parents  PLAN: 1.   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  At this point he can discontinue Plavix.  His lipids have been well controlled.  Most recently total cholesterol is 124, triglycerides 113, HDL 41 and LDL of 60.  Plan to continue both rosuvastatin and Vascepa.  With regards to ED, I encouraged him to try the Viagra however at some point if he wishes to decrease the metoprolol dose we could consider 12.5 mg twice daily but I would be very hesitant to discontinue it due to the beneficial effects.  Follow-up with me annually or sooner as necessary.  Pixie Casino, MD, [facility], [place] Director of the Advanced Lipid Disorders &  Cardiovascular Risk Reduction Clinic Diplomate of the American Board of Clinical Lipidology Attending Cardiologist  Direct Dial: [phone]  Fax: [phone]  Website:  [email or web address] 05/19/2019, 9:04 AM

## 2019-05-19 NOTE — Patient Instructions (Signed)
Medication Instructions:  STOP clopidogrel Continue other current medications   *If you need a refill on your cardiac medications before your next appointment, please call your pharmacy*   Lab Work: NONE If you have labs (blood work) drawn today and your tests are completely normal, you will receive your results only by: Marland Kitchen MyChart Message (if you have MyChart) OR . A paper copy in the mail If you have any lab test that is abnormal or we need to change your treatment, we will call you to review the results.   Testing/Procedures: NONE   Follow-Up: At [facility], you and your health needs are our priority.  As part of our continuing mission to provide you with exceptional heart care, we have created designated Provider Care Teams.  These Care Teams include your primary Cardiologist (physician) and Advanced Practice Providers (APPs -  Physician Assistants and Nurse Practitioners) who all work together to provide you with the care you need, when you need it.  We recommend signing up for the patient portal called "MyChart".  Sign up information is provided on this After Visit Summary.  MyChart is used to connect with patients for Virtual Visits (Telemedicine).  Patients are able to view lab/test results, encounter notes, upcoming appointments, etc.  Non-urgent messages can be sent to your provider as well.   To learn more about what you can do with MyChart, go to [email or web address].    Your next appointment:   12 month(s)  The format for your next appointment:   In Person  Provider:   You may see Pixie Casino, MD or one of the following Advanced Practice Providers on your designated Care Team:    Almyra Deforest, PA-C  Fabian Sharp, PA-C or   Roby Lofts, [place]    Other Instructions

## 2019-06-02 ENCOUNTER — Encounter: Payer: Self-pay | Admitting: Gastroenterology

## 2019-06-23 ENCOUNTER — Other Ambulatory Visit: Payer: Self-pay

## 2019-06-23 ENCOUNTER — Ambulatory Visit (AMBULATORY_SURGERY_CENTER): Payer: Self-pay

## 2019-06-23 VITALS — Temp 97.5°F | Ht 70.0 in | Wt 190.8 lb

## 2019-06-23 DIAGNOSIS — Z86010 Personal history of colonic polyps: Secondary | ICD-10-CM

## 2019-06-23 NOTE — Progress Notes (Signed)
No allergies to soy or egg Pt is not on blood thinners or diet pills Denies issues with sedation/intubation Denies atrial flutter/fib Denies constipation   Emmi instructions given to pt  Pt is aware of Covid safety and care partner requirements.  

## 2019-07-02 ENCOUNTER — Encounter: Payer: Self-pay | Admitting: Gastroenterology

## 2019-07-02 ENCOUNTER — Encounter: Payer: Self-pay | Admitting: Internal Medicine

## 2019-07-07 ENCOUNTER — Ambulatory Visit (AMBULATORY_SURGERY_CENTER): Payer: BC Managed Care – PPO | Admitting: Gastroenterology

## 2019-07-07 ENCOUNTER — Encounter: Payer: Self-pay | Admitting: Gastroenterology

## 2019-07-07 ENCOUNTER — Other Ambulatory Visit: Payer: Self-pay

## 2019-07-07 VITALS — BP 107/69 | HR 54 | Temp 96.7°F | Resp 24 | Ht 70.0 in | Wt 190.8 lb

## 2019-07-07 DIAGNOSIS — D122 Benign neoplasm of ascending colon: Secondary | ICD-10-CM

## 2019-07-07 DIAGNOSIS — Z86010 Personal history of colonic polyps: Secondary | ICD-10-CM | POA: Diagnosis not present

## 2019-07-07 MED ORDER — SODIUM CHLORIDE 0.9 % IV SOLN: 500.0000 mL | Freq: Once | INTRAVENOUS | Status: DC

## 2019-07-07 NOTE — Progress Notes (Signed)
Report given to PACU, vss 

## 2019-07-07 NOTE — Progress Notes (Signed)
Pt's states no medical or surgical changes since previsit or office visit.  Vs-DT  Temp- LS

## 2019-07-07 NOTE — Patient Instructions (Signed)
YOU HAD AN ENDOSCOPIC PROCEDURE TODAY AT THE [facility]:   Refer to the procedure report that was given to you for any specific questions about what was found during the examination.  If the procedure report does not answer your questions, please call your gastroenterologist to clarify.  If you requested that your care partner not be given the details of your procedure findings, then the procedure report has been included in a sealed envelope for you to review at your convenience later.  YOU SHOULD EXPECT: Some feelings of bloating in the abdomen. Passage of more gas than usual.  Walking can help get rid of the air that was put into your GI tract during the procedure and reduce the bloating. If you had a lower endoscopy (such as a colonoscopy or flexible sigmoidoscopy) you may notice spotting of blood in your stool or on the toilet paper. If you underwent a bowel prep for your procedure, you may not have a normal bowel movement for a few days.  Please Note:  You might notice some irritation and congestion in your nose or some drainage.  This is from the oxygen used during your procedure.  There is no need for concern and it should clear up in a day or so.  SYMPTOMS TO REPORT IMMEDIATELY:   Following lower endoscopy (colonoscopy or flexible sigmoidoscopy):  Excessive amounts of blood in the stool  Significant tenderness or worsening of abdominal pains  Swelling of the abdomen that is new, acute  Fever of 100F or higher  For urgent or emergent issues, a gastroenterologist can be reached at any hour by calling [phone]. Do not use MyChart messaging for urgent concerns.    DIET:  We do recommend a small meal at first, but then you may proceed to your regular diet.  Drink plenty of fluids but you should avoid alcoholic beverages for 24 hours.  ACTIVITY:  You should plan to take it easy for the rest of today and you should NOT DRIVE or use heavy machinery until tomorrow (because  of the sedation medicines used during the test).    FOLLOW UP: Our staff will call the number listed on your records 48-72 hours following your procedure to check on you and address any questions or concerns that you may have regarding the information given to you following your procedure. If we do not reach you, we will leave a message.  We will attempt to reach you two times.  During this call, we will ask if you have developed any symptoms of COVID 19. If you develop any symptoms (ie: fever, flu-like symptoms, shortness of breath, cough etc.) before then, please call [phone].  If you test positive for Covid 19 in the 2 weeks post procedure, please call and report this information to us.    If any biopsies were taken you will be contacted by phone or by letter within the next 1-3 weeks.  Please call us at [phone] if you have not heard about the biopsies in 3 weeks.    SIGNATURES/CONFIDENTIALITY: You and/or your care partner have signed paperwork which will be entered into your electronic medical record.  These signatures attest to the fact that that the information above on your After Visit Summary has been reviewed and is understood.  Full responsibility of the confidentiality of this discharge information lies with you and/or your care-partner. 

## 2019-07-07 NOTE — Op Note (Addendum)
[place] Patient Name: Coe Seright Procedure Date: 07/07/2019 1:26 PM MRN: [id number] Endoscopist: Milus Banister , MD Age: 64 Referring MD:  Date of Birth: December 02, 1955 Gender: Male Account #: [id number] Procedure:                Colonoscopy Indications:              High risk colon cancer surveillance: Personal                            history of colonic polyps. Colonoscopy 2013 single                            subCM adenoma removed. Colonoscopy 2018 three subCM                            polyps removed, two retrieved, both adenomas. Medicines:                Monitored Anesthesia Care Procedure:                Pre-Anesthesia Assessment:                           - Prior to the procedure, a History and Physical                            was performed, and patient medications and                            allergies were reviewed. The patient's tolerance of                            previous anesthesia was also reviewed. The risks                            and benefits of the procedure and the sedation                            options and risks were discussed with the patient.                            All questions were answered, and informed consent                            was obtained. Prior Anticoagulants: The patient has                            taken no previous anticoagulant or antiplatelet                            agents. ASA Grade Assessment: II - A patient with                            mild systemic disease. After reviewing the risks  and benefits, the patient was deemed in                            satisfactory condition to undergo the procedure.                           After obtaining informed consent, the colonoscope                            was passed under direct vision. Throughout the                            procedure, the patient's blood pressure, pulse, and                            oxygen  saturations were monitored continuously. The                            Colonoscope was introduced through the anus and                            advanced to the the cecum, identified by                            appendiceal orifice and ileocecal valve. The                            colonoscopy was performed without difficulty. The                            patient tolerated the procedure well. The quality                            of the bowel preparation was good. Scope In: 1:32:47 PM Scope Out: 1:47:01 PM Scope Withdrawal Time: 0 hours 11 minutes 31 seconds  Total Procedure Duration: 0 hours 14 minutes 14 seconds  Findings:                 A 4 mm polyp was found in the ascending colon. The                            polyp was sessile. The polyp was removed with a                            cold snare. Resection and retrieval were complete.                           Multiple small and large-mouthed diverticula were                            found in the entire colon.                           The exam was otherwise without abnormality on  direct and retroflexion views. Complications:            No immediate complications. Estimated blood loss:                            None. Estimated Blood Loss:     Estimated blood loss: none. Impression:               - One 4 mm polyp in the ascending colon, removed                            with a cold snare. Resected and retrieved.                           - Diverticulosis in the entire examined colon.                           - The examination was otherwise normal on direct                            and retroflexion views. Recommendation:           - Patient has a contact number available for                            emergencies. The signs and symptoms of potential                            delayed complications were discussed with the                            patient. Return to normal activities tomorrow.                             Written discharge instructions were provided to the                            patient.                           - Resume previous diet.                           - Continue present medications.                           - Await pathology results. Milus Banister, MD 07/07/2019 1:49:53 PM This report has been signed electronically.

## 2019-07-07 NOTE — Progress Notes (Signed)
Called to room to assist during endoscopic procedure.  Patient ID and intended procedure confirmed with present staff. Received instructions for my participation in the procedure from the performing physician.  

## 2019-07-09 ENCOUNTER — Telehealth: Payer: Self-pay

## 2019-07-09 NOTE — Telephone Encounter (Signed)
  Could not leave vm, wrong number.

## 2019-07-16 ENCOUNTER — Encounter: Payer: Self-pay | Admitting: Gastroenterology

## 2019-07-24 ENCOUNTER — Other Ambulatory Visit: Payer: Self-pay | Admitting: Internal Medicine

## 2019-07-27 ENCOUNTER — Telehealth: Payer: Self-pay | Admitting: Internal Medicine

## 2019-07-27 NOTE — Telephone Encounter (Signed)
PA for vascepa submitted via CMM (KeyBonney Leitz) [id number]

## 2019-07-28 NOTE — Telephone Encounter (Signed)
Request Reference Number: [id number]. VASCEPA CAP 1GM is approved through 07/26/2020

## 2019-12-16 ENCOUNTER — Ambulatory Visit: Payer: BC Managed Care – PPO | Attending: Internal Medicine

## 2019-12-16 DIAGNOSIS — Z23 Encounter for immunization: Secondary | ICD-10-CM

## 2019-12-16 NOTE — Progress Notes (Signed)
   Covid-19 Vaccination Clinic  Name:  JONTHAN LEITE    MRN: [id number] DOB: 1955-06-12  12/16/2019  Mr. Gulino was observed post Covid-19 immunization for 15 minutes without incident. He was provided with Vaccine Information Sheet and instruction to access the V-Safe system.   Mr. Depree was instructed to call 911 with any severe reactions post vaccine: Marland Kitchen Difficulty breathing  . Swelling of face and throat  . A fast heartbeat  . A bad rash all over body  . Dizziness and weakness

## 2020-02-07 ENCOUNTER — Telehealth: Payer: Self-pay | Admitting: Internal Medicine

## 2020-02-07 NOTE — Telephone Encounter (Signed)
PA for vascepa submitted via CMM (Key: [id number])

## 2020-02-07 NOTE — Telephone Encounter (Signed)
[facility]  received a request from your provider for coverage of Vascepa (icosapent ethyl). As long as you remain covered by the [facility] and there are no changes to your plan benefits, this request is approved for the following time period: 02/07/2020 - 02/07/2023

## 2020-05-22 ENCOUNTER — Ambulatory Visit: Payer: BC Managed Care – PPO | Admitting: Internal Medicine

## 2020-05-22 ENCOUNTER — Encounter: Payer: Self-pay | Admitting: Internal Medicine

## 2020-05-22 ENCOUNTER — Other Ambulatory Visit: Payer: Self-pay

## 2020-05-22 VITALS — BP 122/74 | HR 58 | Ht 70.0 in | Wt 196.8 lb

## 2020-05-22 DIAGNOSIS — E781 Pure hyperglyceridemia: Secondary | ICD-10-CM | POA: Diagnosis not present

## 2020-05-22 DIAGNOSIS — Z9861 Coronary angioplasty status: Secondary | ICD-10-CM

## 2020-05-22 DIAGNOSIS — I10 Essential (primary) hypertension: Secondary | ICD-10-CM | POA: Diagnosis not present

## 2020-05-22 DIAGNOSIS — I2510 Atherosclerotic heart disease of native coronary artery without angina pectoris: Secondary | ICD-10-CM | POA: Diagnosis not present

## 2020-05-22 MED ORDER — NITROGLYCERIN 0.4 MG SL SUBL: 0.4000 mg | SUBLINGUAL_TABLET | SUBLINGUAL | 3 refills | Status: DC | PRN

## 2020-05-22 NOTE — Progress Notes (Signed)
OFFICE CONSULT NOTE  Chief Complaint:  Follow-up  Primary Care Physician: Prince Solian, MD  HPI:  Vincent Mendoza is a 65 y.o. male who is being seen today for the evaluation of recent NSTEMI at the request of Avva, Ravisankar, MD. 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  According to his records, he presented at [facility] after awakening from his dead sleep at 3 AM.  He had left-sided shoulder pain radiating anteriorly of the mid chest and neck.  He said it lasted for more than 10 minutes was noted to be about a 4 out of 10 chest pain.  He presented to the emergency department and initial troponin was elevated 0.1.  Subsequent troponins were 0.35 and 0.63.  He was diagnosed with a non-STEMI and treated appropriately.  He did undergo left heart catheterization on 04/20/2018 at [facility].  This demonstrated an 80% proximal LAD stenosis and a 40% distal LAD stenosis.  There was a 50% mid RCA lesion as well.  Subsequently he underwent balloon angioplasty and placement of a 2.5 x 18 mm Onyx resolute drug-eluting stent.  This was postdilated with a 3.0 x 15 mm Quantum apex balloon.  TIMI-3 flow was noted.  He had an echocardiogram as well which showed normal systolic function, mild LVH, very mild hypokinesis of the basal anterior mid anterior and apical anterior walls.  Grade 1 diastolic dysfunction was noted with mild aortic insufficiency.  Since stenting he is done well.  He is got some occasional sharp chest discomfort but no further anginal symptoms.  He has started walking more regularly and says he is committed to changing his dietary habits.  Although he has a history of dyslipidemia he had been on high potency statin therapy with Crestor 20 mg daily.  During a recent physical with his PCP, his labs indicated total cholesterol 158, HDL 50, LDL 79 and triglycerides 147.  He had repeat labs during his hospitalization which showed total  cholesterol of 130, triglycerides 197, HDL 42 and LDL of 49.  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  Overall he says he is doing well.  He is doing home rehabilitation.  Occasionally gets some pain in his left shoulder area which is reminiscent of his anginal symptoms when he starts exercising however does dissipate with more exercise.  He denies any worsening shortness of breath in fact his exercise is getting a little easier.  Recently though he has not been exercising as much due to stress at work.  He hopes to get back to that.  He was successfully started on Vascepa.  We have not yet had repeat lipids.  He plans to see his primary care provider in December will have lab work at that time which I will review.  Most likely we could consider coming off of dual antiplatelet therapy next February.  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 Overall he is doing very well. He denies chest pain or shortness of breath. He is 1 year post-PCI. At this point, I feel we can discontinue his Plavix. His only real complaint is ED - he wonders if the metoprolol may be contributing to that. It is possible, but in my experience of using it frequently over many years, it is less likely.  He did bring this up to his urologist who had prescribed him Viagra but he has not used it consistently.  I feel great importance of using  beta-blockers in patients with non-STEMI and coronary artery disease given multiple cardiovascular benefits.  He was advised not to use nitrates and Viagra concomitantly.  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  He has no significant complaints.  He gets occasional chest discomfort but is not necessarily with exertion or relieved by rest.  He has not used nitroglycerin and his prescriptions now 65 years old.  His blood pressure is excellent today at 122/74.  Labs in January showed a marked improvement in his cholesterol with total 129, HDL 44, LDL 61 and triglycerides  119.  EKG shows a sinus bradycardia at 58 without ischemia.  PMHx:  Past Medical History:  Diagnosis Date  . Diverticulosis   . Gout   . Hyperlipidemia   . Hypertension    stopped taking Lisinopril after a week  . Inguinal hernia    right  . Kidney stone   . Morton's neuroma of left foot   . Myocardial infarction ([place]) 2020   with stent  . Wears glasses     Past Surgical History:  Procedure Laterality Date  . cardiac stent placement    . COLONOSCOPY  2018  . EXCISION MORTON'S NEUROMA Left 03/20/2017   Procedure: Left 3-4 Morton's Neuroma Excision;  Surgeon: Wylene Simmer, MD;  Location: [facility];  Service: Orthopedics;  Laterality: Left;  . INGUINAL HERNIA REPAIR Bilateral 12/29/2014   Procedure: LAPAROSCOPIC BILATERAL INGUINAL HERNIA REPAIR;  Surgeon: Ralene Ok, MD;  Location: [facility];  Service: General;  Laterality: Bilateral;  . INSERTION OF MESH Right 12/29/2014   Procedure: INSERTION OF MESH;  Surgeon: Ralene Ok, MD;  Location: [facility];  Service: General;  Laterality: Right;  . LAPAROSCOPY N/A 12/29/2014   Procedure: LAPAROSCOPY DIAGNOSTIC;  Surgeon: Ralene Ok, MD;  Location: [facility];  Service: General;  Laterality: N/A;  . LITHOTRIPSY     x3  . LIVER BIOPSY N/A 12/29/2014   Procedure: LIVER BIOPSY;  Surgeon: Ralene Ok, MD;  Location: [facility];  Service: General;  Laterality: N/A;  . TONSILLECTOMY    . VASECTOMY      FAMHx:  Family History  Problem Relation Age of Onset  . Heart disease Mother   . Heart disease Father   . Diabetes Mellitus II Father   . Pancreatic cancer Maternal Aunt   . Heart disease Maternal Grandfather   . Heart disease Paternal Grandfather   . Colon cancer Neg Hx   . Colon polyps Neg Hx   . Esophageal cancer Neg Hx   . Stomach cancer Neg Hx   . Rectal cancer Neg Hx     SOCHx:   reports that he has never smoked. He has never used smokeless tobacco. He reports current alcohol use of about 7.0 standard drinks of  alcohol per week. He reports that he does not use drugs.  ALLERGIES:  Allergies  Allergen Reactions  . Lisinopril Cough    ROS: Pertinent items noted in HPI and remainder of comprehensive ROS otherwise negative.  HOME MEDS: Current Outpatient Medications on File Prior to Visit  Medication Sig Dispense Refill  . allopurinol (ZYLOPRIM) 100 MG tablet Take 100 mg by mouth daily.    Marland Kitchen aspirin EC 81 MG tablet Take 81 mg by mouth daily.    . hydrochlorothiazide (MICROZIDE) 12.5 MG capsule Take 12.5 mg by mouth daily.    Marland Kitchen losartan (COZAAR) 100 MG tablet Take 100 mg by mouth daily.    . metoprolol tartrate (LOPRESSOR) 25 MG tablet  Take 25 mg by mouth 2 (two) times daily.    . nitroGLYCERIN (NITROSTAT) 0.4 MG SL tablet Place 0.4 mg under the tongue every 5 (five) minutes as needed for chest pain.    . potassium citrate (UROCIT-K) 10 MEQ (1080 MG) SR tablet Take 10 mEq by mouth 2 (two) times daily.     . rosuvastatin (CRESTOR) 20 MG tablet Take 20 mg by mouth daily.    Marland Kitchen VASCEPA 1 g capsule Take 2 capsules (2 g total) by mouth 2 (two) times daily. 360 capsule 3   No current facility-administered medications on file prior to visit.    LABS/IMAGING: No results found for this or any previous visit (from the past 48 hour(s)). No results found.  LIPID PANEL: No results found for: CHOL, TRIG, HDL, CHOLHDL, VLDL, LDLCALC, LDLDIRECT  WEIGHTS: Wt Readings from Last 3 Encounters:  05/22/20 196 lb 12.8 oz (89.3 kg)  07/07/19 190 lb 12.8 oz (86.5 kg)  06/23/19 190 lb 12.8 oz (86.5 kg)    VITALS: Ht 5\' 10"  (1.778 m)   Wt 196 lb 12.8 oz (89.3 kg)   BMI 28.24 kg/m   EXAM: General appearance: alert and no distress Neck: no carotid bruit, no JVD and thyroid not enlarged, symmetric, no tenderness/mass/nodules Lungs: clear to auscultation bilaterally Heart: regular rate and rhythm, S1, S2 normal and diastolic murmur: early diastolic 2/6, blowing at 2nd right intercostal space Abdomen: soft,  non-tender; bowel sounds normal; no masses,  no organomegaly Extremities: extremities normal, atraumatic, no cyanosis or edema Pulses: 2+ and symmetric Skin: Skin color, texture, turgor normal. No rashes or lesions Neurologic: Grossly normal Psych: Pleasant  EKG: Sinus bradycardia at 58-personally reviewed  ASSESSMENT: 1. NSTEMI 2. CAD status post DES to the proximal LAD with residual 50% mid RCA disease and distal LAD disease (04/20/2018) 3. Dyslipidemia 4. Hypertension 5. Family history of coronary disease in both parents  PLAN: 1.   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  Blood pressure is at goal.  His cholesterol is at target with LDL less than 70 and triglycerides have normalized.  We will see if we can provide Vascepa possibly free through a mail order process that is available now.  I do not think he is having anginal chest pain but will refill his nitro.  He should be mindful of symptoms that occur with exertion and are relieved at rest.  Follow-up with me annually or sooner as necessary.  Pixie Casino, MD, [facility], [place] Director of the Advanced Lipid Disorders &  Cardiovascular Risk Reduction Clinic Diplomate of the American Board of Clinical Lipidology Attending Cardiologist  Direct Dial: [phone]  Fax: [phone]  Website:  [email or web address] 05/22/2020, 8:13 AM

## 2020-05-22 NOTE — Patient Instructions (Signed)
Medication Instructions:  Your physician recommends that you continue on your current medications as directed. Please refer to the Current Medication list given to you today.  [facility] - phone: [phone]  *If you need a refill on your cardiac medications before your next appointment, please call your pharmacy*   Follow-Up: At [facility], you and your health needs are our priority.  As part of our continuing mission to provide you with exceptional heart care, we have created designated Provider Care Teams.  These Care Teams include your primary Cardiologist (physician) and Advanced Practice Providers (APPs -  Physician Assistants and Nurse Practitioners) who all work together to provide you with the care you need, when you need it.  We recommend signing up for the patient portal called "MyChart".  Sign up information is provided on this After Visit Summary.  MyChart is used to connect with patients for Virtual Visits (Telemedicine).  Patients are able to view lab/test results, encounter notes, upcoming appointments, etc.  Non-urgent messages can be sent to your provider as well.   To learn more about what you can do with MyChart, go to [email or web address].    Your next appointment:   12 month(s)  The format for your next appointment:   In Person  Provider:   You may see Pixie Casino, MD or one of the following Advanced Practice Providers on your designated Care Team:    Almyra Deforest, PA-C  Fabian Sharp, PA-C or   Roby Lofts, [place]    Other Instructions

## 2020-05-23 ENCOUNTER — Telehealth: Payer: Self-pay | Admitting: Internal Medicine

## 2020-05-23 NOTE — Telephone Encounter (Signed)
Phoned in Rx for Vascepa 1gm capsules - take 2 caps PO BID - #360 w/3 refills to [facility] @ [phone]

## 2020-10-26 ENCOUNTER — Telehealth: Payer: Self-pay

## 2020-10-26 NOTE — Telephone Encounter (Signed)
Hi Dr. Debara Pickett,   Vincent Mendoza has shoulder surgery planned for November and surgeon is asking to hold Aspirin. Patient has a history of CAD s/p DES to proximal LAD in 03/2018. He was last seen by you in 04/2020 at which time he reported occasional chest discomfort but not necessarily with exertion or relieved by rest. This was not felt to be angina and he was advised to follow-up in 1 year. Can you please comment on how long Aspirin can be held?  Please route response back to P CV DIV PREOP.  Thank you! [name]

## 2020-10-26 NOTE — Telephone Encounter (Signed)
   [facility] Pre-operative Risk Assessment    Patient Name: Vincent Mendoza  DOB: [birth year] MRN: [id number]  HEARTCARE STAFF:  - IMPORTANT!!!!!! Under Visit Info/Reason for Call, type in Other and utilize the format Clearance MM/DD/YY or Clearance TBD. Do not use dashes or single digits. - Please review there is not already an duplicate clearance open for this procedure. - If request is for dental extraction, please clarify the # of teeth to be extracted. - If the patient is currently at the dentist's office, call Pre-Op Callback Staff (MA/nurse) to input urgent request.  - If the patient is not currently in the dentist office, please route to the Pre-Op pool.  Request for surgical clearance:  What type of surgery is being performed? Rt Shoulder Scope Rotator Cuff Repair   When is this surgery scheduled? 01/02/2021  What type of clearance is required (medical clearance vs. Pharmacy clearance to hold med vs. Both)? Both   Are there any medications that need to be held prior to surgery and how long? Aspirin   Practice name and name of physician performing surgery? [facility]  Robert A. Noemi Chapel, MD   What is the office phone number? [phone]   7.   What is the office fax number? [phone] Attn: Claiborne Billings  8.   Anesthesia type (None, local, MAC, general) ? Unknown    Vincent Mendoza 10/26/2020, 2:45 PM  _________________________________________________________________   (provider comments below)

## 2020-10-27 NOTE — Telephone Encounter (Signed)
   Primary Cardiologist: Pixie Casino, MD  Chart reviewed as part of pre-operative protocol coverage. Given past medical history and time since last visit, based on ACC/AHA guidelines, Vincent Mendoza would be at acceptable risk for the planned procedure without further cardiovascular testing.   His aspirin may be held for 7 days prior to his surgery.  Please resume as soon as hemostasis is achieved.  I will route this recommendation to the requesting party via Epic fax function and remove from pre-op pool.  Please call with questions.  Jossie Ng. Havoc Sanluis NP-C    10/27/2020, 8:55 AM [place] [address] Office [phone] Fax [phone]

## 2020-10-27 NOTE — Telephone Encounter (Signed)
[place] for surgery - ok to hold aspirin 7 days prior and restart after.  Dr Debara Pickett

## 2020-12-13 ENCOUNTER — Encounter (HOSPITAL_BASED_OUTPATIENT_CLINIC_OR_DEPARTMENT_OTHER): Payer: Self-pay | Admitting: Orthopedic Surgery

## 2020-12-13 DIAGNOSIS — S46011D Strain of muscle(s) and tendon(s) of the rotator cuff of right shoulder, subsequent encounter: Secondary | ICD-10-CM

## 2020-12-13 DIAGNOSIS — M19019 Primary osteoarthritis, unspecified shoulder: Secondary | ICD-10-CM

## 2020-12-13 DIAGNOSIS — M7541 Impingement syndrome of right shoulder: Secondary | ICD-10-CM | POA: Diagnosis present

## 2020-12-13 HISTORY — DX: Impingement syndrome of right shoulder: M75.41

## 2020-12-13 HISTORY — DX: Primary osteoarthritis, unspecified shoulder: M19.019

## 2020-12-13 NOTE — H&P (Signed)
Vincent Mendoza is an 65 y.o. male.   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  His left shoulder bothers him some, but it is getting better.  He is also having posterior hip and buttock pain secondary to going to a stretch zone facility where they overstretched his hamstring area.  He is on Allopurinol for gout. MRI revealed a high grade partial versus complete rotator cuff tear.  He is 70% improved with his shoulder, but still having pain and weakness.  He is also having pain in his right elbow lateral epicondylar region from repetitive trauma.  Past Medical History:  Diagnosis Date   Acromioclavicular joint arthritis 12/13/2020   Diverticulosis    Gout    Hyperlipidemia    Hypertension    stopped taking Lisinopril after a week   Impingement syndrome of right shoulder 12/13/2020   Inguinal hernia    right   Kidney stone    Morton's neuroma of left foot    Myocardial infarction [facility]) 2020   with stent   Wears glasses     Past Surgical History:  Procedure Laterality Date   cardiac stent placement     COLONOSCOPY  2018   EXCISION MORTON'S NEUROMA Left 03/20/2017   Procedure: Left 3-4 Morton's Neuroma Excision;  Surgeon: Wylene Simmer, MD;  Location: [place];  Service: Orthopedics;  Laterality: Left;   INGUINAL HERNIA REPAIR Bilateral 12/29/2014   Procedure: LAPAROSCOPIC BILATERAL INGUINAL HERNIA REPAIR;  Surgeon: Ralene Ok, MD;  Location: [place];  Service: General;  Laterality: Bilateral;   INSERTION OF MESH Right 12/29/2014   Procedure: INSERTION OF MESH;  Surgeon: Ralene Ok, MD;  Location: [place];  Service: General;  Laterality: Right;   LAPAROSCOPY N/A 12/29/2014   Procedure: LAPAROSCOPY DIAGNOSTIC;  Surgeon: Ralene Ok, MD;  Location: [place];  Service: General;  Laterality: N/A;   LITHOTRIPSY     x3   LIVER  BIOPSY N/A 12/29/2014   Procedure: LIVER BIOPSY;  Surgeon: Ralene Ok, MD;  Location: [place];  Service: General;  Laterality: N/A;   TONSILLECTOMY     VASECTOMY      Family History  Problem Relation Age of Onset   Heart disease Mother    Heart disease Father    Diabetes Mellitus II Father    Pancreatic cancer Maternal Aunt    Heart disease Maternal Grandfather    Heart disease Paternal Grandfather    Colon cancer Neg Hx    Colon polyps Neg Hx    Esophageal cancer Neg Hx    Stomach cancer Neg Hx    Rectal cancer Neg Hx    Social History:  reports that he has never smoked. He has never used smokeless tobacco. He reports current alcohol use of about 7.0 standard drinks per week. He reports that he does not use drugs.  Allergies:  Allergies  Allergen Reactions   Lisinopril Cough    No medications prior to admission.    No results found for this or any previous visit (from the past 48 hour(s)). No results found.  Review of Systems  Constitutional: Negative.   HENT: Negative.    Eyes: Negative.   Respiratory: Negative.    Cardiovascular: Negative.   Gastrointestinal: Negative.   Endocrine: Negative.   Genitourinary: Negative.   Musculoskeletal:  Positive for arthralgias and joint swelling.  Skin: Negative.  Allergic/Immunologic: Negative.   Neurological: Negative.   Hematological: Negative.   Psychiatric/Behavioral: Negative.     There were no vitals taken for this visit. Physical Exam Constitutional:      Appearance: Normal appearance.  HENT:     Head: Normocephalic and atraumatic.     Right Ear: External ear normal.     Left Ear: External ear normal.     Nose: Nose normal.     Mouth/Throat:     Mouth: Mucous membranes are moist.  Eyes:     Conjunctiva/sclera: Conjunctivae normal.  Cardiovascular:     Rate and Rhythm: Normal rate.     Pulses: Normal pulses.  Pulmonary:     Effort: Pulmonary effort is normal.  Abdominal:     Palpations: Abdomen is  soft.  Genitourinary:    Comments: Not pertinent to current symptomatology therefore not examined.  Musculoskeletal:     Cervical back: Neck supple.     Comments: Examination of his right shoulder reveals 80% range of motion with pain and weakness on rotator cuff stressing.  Examination of his right elbow reveals pain on the lateral epicondyle.  Pain on gripping and extensor tendon stressing.  Full range of motion.  Elbow is stable.    Skin:    General: Skin is warm.     Capillary Refill: Capillary refill takes less than 2 seconds.  Neurological:     General: No focal deficit present.     Mental Status: He is alert.  Psychiatric:        Mood and Affect: Mood normal.        Behavior: Behavior normal.     Assessment Principal Problem:   Traumatic rotator cuff tear, right, subsequent encounter Active Problems:   Diverticulitis of colon   Coronary artery disease involving native coronary artery of native heart with angina pectoris with documented spasm (HCC)   Status post angioplasty with stent   Impingement syndrome of right shoulder   Plan We will proceed with right shoulder arthroscopy with rotator cuff repair with subacromial decompression and DCE.  Risks, complications and benefits of the surgery have been described to him in detail and he understands this completely.    Tarrin Menn J Miquel Stacks, PA-C 12/13/2020, 3:15 PM

## 2020-12-25 ENCOUNTER — Encounter (HOSPITAL_BASED_OUTPATIENT_CLINIC_OR_DEPARTMENT_OTHER)
Admission: RE | Admit: 2020-12-25 | Discharge: 2020-12-25 | Disposition: A | Discharge status: Home / Self Care | Payer: BC Managed Care – PPO | Source: Ambulatory Visit | Attending: Orthopedic Surgery | Admitting: Orthopedic Surgery

## 2020-12-25 ENCOUNTER — Encounter (HOSPITAL_BASED_OUTPATIENT_CLINIC_OR_DEPARTMENT_OTHER): Payer: Self-pay | Admitting: Orthopedic Surgery

## 2020-12-25 DIAGNOSIS — Z01812 Encounter for preprocedural laboratory examination: Secondary | ICD-10-CM | POA: Insufficient documentation

## 2020-12-25 LAB — BASIC METABOLIC PANEL
Anion gap: 9 (ref 5–15)
BUN: 24 mg/dL — ABNORMAL HIGH (ref 8–23)
CO2: 27 mmol/L (ref 22–32)
Calcium: 9.2 mg/dL (ref 8.9–10.3)
Chloride: 102 mmol/L (ref 98–111)
Creatinine, Ser: 1.17 mg/dL (ref 0.61–1.24)
GFR, Estimated: >60 mL/min (ref >60)
Glucose, Bld: 109 mg/dL — ABNORMAL HIGH (ref 70–99)
Potassium: 4.6 mmol/L (ref 3.5–5.1)
Sodium: 138 mmol/L (ref 135–145)

## 2020-12-25 NOTE — H&P (View-Only) (Signed)
      Enhanced Recovery after Surgery for Orthopedics Enhanced Recovery after Surgery is a protocol used to improve the stress on your body and your recovery after surgery.  Patient Instructions  The night before surgery:  No food after midnight. ONLY clear liquids after midnight  The day of surgery (if you do NOT have diabetes):  Drink ONE (1) Pre-Surgery Clear Ensure as directed.   This drink was given to you during your hospital  pre-op appointment visit. The pre-op nurse will instruct you on the time to drink the  Pre-Surgery Ensure depending on your surgery time. Finish the drink at the designated time by the pre-op nurse.  Nothing else to drink after completing the  Pre-Surgery Clear Ensure.  The day of surgery (if you have diabetes): Drink ONE (1) Gatorade 2 (G2) as directed. This drink was given to you during your hospital  pre-op appointment visit.  The pre-op nurse will instruct you on the time to drink the   Gatorade 2 (G2) depending on your surgery time. Color of the Gatorade may vary. Red is not allowed. Nothing else to drink after completing the  Gatorade 2 (G2).         If you have questions, please contact your surgeon's office. Surgical soap given with instructions, pt verbalized understanding. Benzoyl peroxide gel given with written instructions, pt verbalized understanding.

## 2020-12-25 NOTE — Progress Notes (Signed)
      Enhanced Recovery after Surgery for Orthopedics Enhanced Recovery after Surgery is a protocol used to improve the stress on your body and your recovery after surgery.  Patient Instructions  The night before surgery:  No food after midnight. ONLY clear liquids after midnight  The day of surgery (if you do NOT have diabetes):  Drink ONE (1) Pre-Surgery Clear Ensure as directed.   This drink was given to you during your hospital  pre-op appointment visit. The pre-op nurse will instruct you on the time to drink the  Pre-Surgery Ensure depending on your surgery time. Finish the drink at the designated time by the pre-op nurse.  Nothing else to drink after completing the  Pre-Surgery Clear Ensure.  The day of surgery (if you have diabetes): Drink ONE (1) Gatorade 2 (G2) as directed. This drink was given to you during your hospital  pre-op appointment visit.  The pre-op nurse will instruct you on the time to drink the   Gatorade 2 (G2) depending on your surgery time. Color of the Gatorade may vary. Red is not allowed. Nothing else to drink after completing the  Gatorade 2 (G2).         If you have questions, please contact your surgeon's office. Surgical soap given with instructions, pt verbalized understanding. Benzoyl peroxide gel given with written instructions, pt verbalized understanding.

## 2021-01-02 ENCOUNTER — Encounter (HOSPITAL_BASED_OUTPATIENT_CLINIC_OR_DEPARTMENT_OTHER): Payer: Self-pay | Admitting: Orthopedic Surgery

## 2021-01-02 ENCOUNTER — Other Ambulatory Visit: Payer: Self-pay

## 2021-01-02 ENCOUNTER — Ambulatory Visit (HOSPITAL_BASED_OUTPATIENT_CLINIC_OR_DEPARTMENT_OTHER): Payer: BC Managed Care – PPO | Admitting: Anesthesiology

## 2021-01-02 ENCOUNTER — Ambulatory Visit (HOSPITAL_BASED_OUTPATIENT_CLINIC_OR_DEPARTMENT_OTHER)
Admission: RE | Admit: 2021-01-02 | Discharge: 2021-01-02 | Disposition: A | Discharge status: Home / Self Care | Payer: BC Managed Care – PPO | Attending: Orthopedic Surgery | Admitting: Orthopedic Surgery

## 2021-01-02 ENCOUNTER — Encounter (HOSPITAL_BASED_OUTPATIENT_CLINIC_OR_DEPARTMENT_OTHER)
Admission: RE | Disposition: A | Discharge status: Home / Self Care | Payer: Self-pay | Source: Home / Self Care | Attending: Orthopedic Surgery

## 2021-01-02 DIAGNOSIS — K5732 Diverticulitis of large intestine without perforation or abscess without bleeding: Secondary | ICD-10-CM | POA: Diagnosis present

## 2021-01-02 DIAGNOSIS — I25111 Atherosclerotic heart disease of native coronary artery with angina pectoris with documented spasm: Secondary | ICD-10-CM | POA: Diagnosis present

## 2021-01-02 DIAGNOSIS — X58XXXA Exposure to other specified factors, initial encounter: Secondary | ICD-10-CM | POA: Diagnosis not present

## 2021-01-02 DIAGNOSIS — M1990 Unspecified osteoarthritis, unspecified site: Secondary | ICD-10-CM | POA: Diagnosis not present

## 2021-01-02 DIAGNOSIS — Z95820 Peripheral vascular angioplasty status with implants and grafts: Secondary | ICD-10-CM

## 2021-01-02 DIAGNOSIS — M19019 Primary osteoarthritis, unspecified shoulder: Secondary | ICD-10-CM | POA: Diagnosis present

## 2021-01-02 DIAGNOSIS — Z79899 Other long term (current) drug therapy: Secondary | ICD-10-CM | POA: Diagnosis not present

## 2021-01-02 DIAGNOSIS — I10 Essential (primary) hypertension: Secondary | ICD-10-CM | POA: Diagnosis not present

## 2021-01-02 DIAGNOSIS — S46011D Strain of muscle(s) and tendon(s) of the rotator cuff of right shoulder, subsequent encounter: Secondary | ICD-10-CM

## 2021-01-02 DIAGNOSIS — S43491A Other sprain of right shoulder joint, initial encounter: Secondary | ICD-10-CM | POA: Insufficient documentation

## 2021-01-02 DIAGNOSIS — M75111 Incomplete rotator cuff tear or rupture of right shoulder, not specified as traumatic: Secondary | ICD-10-CM | POA: Diagnosis present

## 2021-01-02 DIAGNOSIS — M25811 Other specified joint disorders, right shoulder: Secondary | ICD-10-CM | POA: Insufficient documentation

## 2021-01-02 DIAGNOSIS — M7541 Impingement syndrome of right shoulder: Secondary | ICD-10-CM | POA: Diagnosis present

## 2021-01-02 HISTORY — DX: Sleep apnea, unspecified: G47.30

## 2021-01-02 HISTORY — PX: SHOULDER ARTHROSCOPY WITH ROTATOR CUFF REPAIR AND SUBACROMIAL DECOMPRESSION: SHX5686

## 2021-01-02 HISTORY — PX: SHOULDER ARTHROSCOPY WITH BICEPS TENDON REPAIR: SHX5674

## 2021-01-02 SURGERY — SHOULDER ARTHROSCOPY WITH ROTATOR CUFF REPAIR AND SUBACROMIAL DECOMPRESSION
Anesthesia: General | Site: Shoulder | Laterality: Right

## 2021-01-02 MED ORDER — ROCURONIUM BROMIDE 10 MG/ML (PF) SYRINGE
PREFILLED_SYRINGE | INTRAVENOUS | Status: AC
  Filled 2021-01-02: qty 10

## 2021-01-02 MED ORDER — PROPOFOL 10 MG/ML IV BOLUS
INTRAVENOUS | Status: AC
  Filled 2021-01-02: qty 20

## 2021-01-02 MED ORDER — FENTANYL CITRATE (PF) 100 MCG/2ML IJ SOLN
INTRAMUSCULAR | Status: AC
  Filled 2021-01-02: qty 2

## 2021-01-02 MED ORDER — ONDANSETRON HCL 4 MG/2ML IJ SOLN
INTRAMUSCULAR | Status: DC | PRN
  Administered 2021-01-02: 4 mg via INTRAVENOUS

## 2021-01-02 MED ORDER — SUGAMMADEX SODIUM 500 MG/5ML IV SOLN
INTRAVENOUS | Status: DC | PRN
  Administered 2021-01-02: 300 mg via INTRAVENOUS

## 2021-01-02 MED ORDER — LACTATED RINGERS IV SOLN: INTRAVENOUS | Status: DC

## 2021-01-02 MED ORDER — FENTANYL CITRATE (PF) 100 MCG/2ML IJ SOLN
100.0000 ug | Freq: Once | INTRAMUSCULAR | Status: AC
  Administered 2021-01-02: 50 ug via INTRAVENOUS

## 2021-01-02 MED ORDER — MIDAZOLAM HCL 2 MG/2ML IJ SOLN
2.0000 mg | Freq: Once | INTRAMUSCULAR | Status: AC
  Administered 2021-01-02: 2 mg via INTRAVENOUS

## 2021-01-02 MED ORDER — OXYCODONE HCL 5 MG PO TABS: 5.0000 mg | ORAL_TABLET | ORAL | 0 refills | Status: DC | PRN

## 2021-01-02 MED ORDER — ACETAMINOPHEN 500 MG PO TABS
1000.0000 mg | ORAL_TABLET | Freq: Once | ORAL | Status: AC
  Administered 2021-01-02: 1000 mg via ORAL

## 2021-01-02 MED ORDER — CEFAZOLIN SODIUM-DEXTROSE 2-4 GM/100ML-% IV SOLN
INTRAVENOUS | Status: AC
  Filled 2021-01-02: qty 100

## 2021-01-02 MED ORDER — BUPIVACAINE-EPINEPHRINE (PF) 0.25% -1:200000 IJ SOLN
INTRAMUSCULAR | Status: AC
  Filled 2021-01-02: qty 60

## 2021-01-02 MED ORDER — HYDROMORPHONE HCL 1 MG/ML IJ SOLN: 0.2500 mg | INTRAMUSCULAR | Status: DC | PRN

## 2021-01-02 MED ORDER — ROCURONIUM BROMIDE 100 MG/10ML IV SOLN
INTRAVENOUS | Status: DC | PRN
  Administered 2021-01-02: 50 mg via INTRAVENOUS
  Administered 2021-01-02: 10 mg via INTRAVENOUS
  Administered 2021-01-02: 20 mg via INTRAVENOUS

## 2021-01-02 MED ORDER — PROPOFOL 10 MG/ML IV BOLUS
INTRAVENOUS | Status: DC | PRN
  Administered 2021-01-02: 150 mg via INTRAVENOUS

## 2021-01-02 MED ORDER — MIDAZOLAM HCL 2 MG/2ML IJ SOLN
INTRAMUSCULAR | Status: AC
  Filled 2021-01-02: qty 2

## 2021-01-02 MED ORDER — LIDOCAINE 2% (20 MG/ML) 5 ML SYRINGE
INTRAMUSCULAR | Status: AC
  Filled 2021-01-02: qty 5

## 2021-01-02 MED ORDER — CEFAZOLIN SODIUM-DEXTROSE 2-4 GM/100ML-% IV SOLN
2.0000 g | INTRAVENOUS | Status: AC
  Administered 2021-01-02: 2 g via INTRAVENOUS

## 2021-01-02 MED ORDER — CYCLOBENZAPRINE HCL 5 MG PO TABS: 5.0000 mg | ORAL_TABLET | Freq: Three times a day (TID) | ORAL | 0 refills | Status: DC | PRN

## 2021-01-02 MED ORDER — SODIUM CHLORIDE 0.9 % IR SOLN
Status: DC | PRN
  Administered 2021-01-02: 12000 mL

## 2021-01-02 MED ORDER — DEXAMETHASONE SODIUM PHOSPHATE 10 MG/ML IJ SOLN: 8.0000 mg | Freq: Once | INTRAMUSCULAR | Status: DC

## 2021-01-02 MED ORDER — EPHEDRINE SULFATE 50 MG/ML IJ SOLN
INTRAMUSCULAR | Status: DC | PRN
  Administered 2021-01-02: 15 mg via INTRAVENOUS
  Administered 2021-01-02: 10 mg via INTRAVENOUS

## 2021-01-02 MED ORDER — PHENYLEPHRINE HCL (PRESSORS) 10 MG/ML IV SOLN
INTRAVENOUS | Status: DC | PRN
  Administered 2021-01-02: 120 ug via INTRAVENOUS
  Administered 2021-01-02: 80 ug via INTRAVENOUS

## 2021-01-02 MED ORDER — POVIDONE-IODINE 7.5 % EX SOLN
Freq: Once | CUTANEOUS | Status: AC
  Filled 2021-01-02: qty 118

## 2021-01-02 MED ORDER — EPINEPHRINE PF 1 MG/ML IJ SOLN
INTRAMUSCULAR | Status: AC
  Filled 2021-01-02: qty 4

## 2021-01-02 MED ORDER — POVIDONE-IODINE 10 % EX SWAB
2.0000 "application " | Freq: Once | CUTANEOUS | Status: AC
  Administered 2021-01-02: 2 via TOPICAL

## 2021-01-02 MED ORDER — BUPIVACAINE-EPINEPHRINE (PF) 0.5% -1:200000 IJ SOLN
INTRAMUSCULAR | Status: DC | PRN
  Administered 2021-01-02: 15 mL via PERINEURAL

## 2021-01-02 MED ORDER — ACETAMINOPHEN 500 MG PO TABS
ORAL_TABLET | ORAL | Status: AC
  Filled 2021-01-02: qty 2

## 2021-01-02 MED ORDER — BUPIVACAINE LIPOSOME 1.3 % IJ SUSP
INTRAMUSCULAR | Status: DC | PRN
  Administered 2021-01-02: 10 mL via PERINEURAL

## 2021-01-02 MED ORDER — DEXAMETHASONE SODIUM PHOSPHATE 4 MG/ML IJ SOLN
INTRAMUSCULAR | Status: DC | PRN
  Administered 2021-01-02: 5 mg via INTRAVENOUS

## 2021-01-02 SURGICAL SUPPLY — 76 items
AID PSTN UNV HD RSTRNT DISP (MISCELLANEOUS) ×2
ANCH SUT SWLK 19.1X4.75 (Anchor) ×4 IMPLANT
ANCHOR SUT 1.8 FBRTK KNTLS 2SU (Anchor) ×2 IMPLANT
ANCHOR SUT BIO SW 4.75X19.1 (Anchor) ×2 IMPLANT
APL SKNCLS STERI-STRIP NONHPOA (GAUZE/BANDAGES/DRESSINGS)
BENZOIN TINCTURE PRP APPL 2/3 (GAUZE/BANDAGES/DRESSINGS) IMPLANT
BLADE EXCALIBUR 4.0X13 (MISCELLANEOUS) IMPLANT
BLADE SURG 15 STRL LF DISP TIS (BLADE) IMPLANT
BLADE SURG 15 STRL SS (BLADE)
BNDG COHESIVE 4X5 TAN ST LF (GAUZE/BANDAGES/DRESSINGS) IMPLANT
BURR OVAL 8 FLU 5.0X13 (MISCELLANEOUS) ×3 IMPLANT
CANNULA TWIST IN 8.25X7CM (CANNULA) ×2 IMPLANT
DECANTER SPIKE VIAL GLASS SM (MISCELLANEOUS) IMPLANT
DISSECTOR  3.8MM X 13CM (MISCELLANEOUS) ×3
DISSECTOR 3.8MM X 13CM (MISCELLANEOUS) ×2 IMPLANT
DRAPE SHOULDER BEACH CHAIR (DRAPES) ×3 IMPLANT
DRAPE U-SHAPE 47X51 STRL (DRAPES) ×6 IMPLANT
DRSG PAD ABDOMINAL 8X10 ST (GAUZE/BANDAGES/DRESSINGS) ×3 IMPLANT
DURAPREP 26ML APPLICATOR (WOUND CARE) ×3 IMPLANT
ELECT REM PT RETURN 9FT ADLT (ELECTROSURGICAL)
ELECTRODE REM PT RTRN 9FT ADLT (ELECTROSURGICAL) ×2 IMPLANT
GAUZE SPONGE 4X4 12PLY STRL (GAUZE/BANDAGES/DRESSINGS) ×3 IMPLANT
GAUZE XEROFORM 1X8 LF (GAUZE/BANDAGES/DRESSINGS) ×3 IMPLANT
GLOVE SRG 8 PF TXTR STRL LF DI (GLOVE) IMPLANT
GLOVE SURG ENC MOIS LTX SZ7 (GLOVE) ×1 IMPLANT
GLOVE SURG MICRO LTX SZ7.5 (GLOVE) ×3 IMPLANT
GLOVE SURG UNDER POLY LF SZ7 (GLOVE) IMPLANT
GLOVE SURG UNDER POLY LF SZ7.5 (GLOVE) ×3 IMPLANT
GLOVE SURG UNDER POLY LF SZ8 (GLOVE) ×3
GOWN STRL REUS W/ TWL LRG LVL3 (GOWN DISPOSABLE) ×4 IMPLANT
GOWN STRL REUS W/TWL 2XL LVL3 (GOWN DISPOSABLE) ×1 IMPLANT
GOWN STRL REUS W/TWL LRG LVL3 (GOWN DISPOSABLE) ×6
KIT STR SPEAR 1.8 FBRTK DISP (KITS) ×1 IMPLANT
LOOP 2 FIBERLINK CLOSED (SUTURE) IMPLANT
MANIFOLD NEPTUNE II (INSTRUMENTS) ×3 IMPLANT
NDL 1/2 CIR CATGUT .05X1.09 (NEEDLE) IMPLANT
NDL SAFETY ECLIPSE 18X1.5 (NEEDLE) ×2 IMPLANT
NDL SCORPION MULTI FIRE (NEEDLE) IMPLANT
NDL SUT 6 .5 CRC .975X.05 MAYO (NEEDLE) IMPLANT
NEEDLE 1/2 CIR CATGUT .05X1.09 (NEEDLE) IMPLANT
NEEDLE HYPO 18GX1.5 SHARP (NEEDLE) ×3
NEEDLE MAYO TAPER (NEEDLE)
NEEDLE SCORPION MULTI FIRE (NEEDLE) ×3 IMPLANT
PACK ARTHROSCOPY DSU (CUSTOM PROCEDURE TRAY) ×3 IMPLANT
PACK BASIN DAY SURGERY FS (CUSTOM PROCEDURE TRAY) ×3 IMPLANT
PAD ALCOHOL SWAB (MISCELLANEOUS) ×6 IMPLANT
PENCIL SMOKE EVACUATOR (MISCELLANEOUS) IMPLANT
PORT APPOLLO RF 90DEGREE MULTI (SURGICAL WAND) IMPLANT
RESTRAINT HEAD UNIVERSAL NS (MISCELLANEOUS) ×3 IMPLANT
SHEET MEDIUM DRAPE 40X70 STRL (DRAPES) IMPLANT
SLEEVE SCD COMPRESS KNEE MED (STOCKING) IMPLANT
SLING ARM FOAM STRAP LRG (SOFTGOODS) IMPLANT
SLING ULTRA III MED (ORTHOPEDIC SUPPLIES) IMPLANT
SPONGE T-LAP 4X18 ~~LOC~~+RFID (SPONGE) ×1 IMPLANT
STRIP CLOSURE SKIN 1/2X4 (GAUZE/BANDAGES/DRESSINGS) IMPLANT
SUCTION FRAZIER HANDLE 10FR (MISCELLANEOUS)
SUCTION TUBE FRAZIER 10FR DISP (MISCELLANEOUS) IMPLANT
SUT ETHILON 3 0 PS 1 (SUTURE) ×4 IMPLANT
SUT FIBERWIRE #2 38 T-5 BLUE (SUTURE)
SUT PDS AB 2-0 CT2 27 (SUTURE) ×1 IMPLANT
SUT PROLENE 3 0 PS 2 (SUTURE) IMPLANT
SUT TIGER TAPE 7 IN WHITE (SUTURE) IMPLANT
SUT VIC AB 2-0 PS2 27 (SUTURE) IMPLANT
SUT VIC AB 2-0 SH 27 (SUTURE)
SUT VIC AB 2-0 SH 27XBRD (SUTURE) IMPLANT
SUT VICRYL 0 SH 27 (SUTURE) IMPLANT
SUTURE FIBERWR #2 38 T-5 BLUE (SUTURE) IMPLANT
SYR 5ML LL (SYRINGE) ×3 IMPLANT
SYR BULB EAR ULCER 3OZ GRN STR (SYRINGE) IMPLANT
TAPE FIBER 2MM 7IN #2 BLUE (SUTURE) ×1 IMPLANT
TAPE HYPAFIX 6X30 (GAUZE/BANDAGES/DRESSINGS) IMPLANT
TAPE STRIPS DRAPE STRL (GAUZE/BANDAGES/DRESSINGS) ×3 IMPLANT
TOWEL GREEN STERILE FF (TOWEL DISPOSABLE) ×3 IMPLANT
TUBE CONNECTING 20X1/4 (TUBING) ×3 IMPLANT
TUBING ARTHROSCOPY IRRIG 16FT (MISCELLANEOUS) ×3 IMPLANT
WATER STERILE IRR 1000ML POUR (IV SOLUTION) ×3 IMPLANT

## 2021-01-02 NOTE — Interval H&P Note (Signed)
History and Physical Interval Note:  01/02/2021 10:32 AM  Vincent Mendoza  has presented today for surgery, with the diagnosis of [place].  The various methods of treatment have been discussed with the patient and family. After consideration of risks, benefits and other options for treatment, the patient has consented to  Procedure(s): SHOULDER ARTHROSCOPY WITH ROTATOR CUFF REPAIR AND SUBACROMIAL DECOMPRESSION, DISTAL CLAVICLE EXCISION (Right) as a surgical intervention.  The patient's history has been reviewed, patient examined, no change in status, stable for surgery.  I have reviewed the patient's chart and labs.  Questions were answered to the patient's satisfaction.     Lorn Junes

## 2021-01-02 NOTE — Anesthesia Preprocedure Evaluation (Addendum)
Anesthesia Evaluation  Patient identified by MRN, date of birth, ID band Patient awake    Reviewed: Allergy & Precautions, H&P , NPO status , Patient's Chart, lab work & pertinent test results, reviewed documented beta blocker date and time   Airway Mallampati: II  TM Distance: >3 FB Neck ROM: Full    Dental no notable dental hx. (+) Teeth Intact, Dental Advisory Given   Pulmonary sleep apnea ,    Pulmonary exam normal breath sounds clear to auscultation       Cardiovascular hypertension, Pt. on medications and Pt. on home beta blockers + CAD, + Past MI and + Cardiac Stents   Rhythm:Regular Rate:Normal     Neuro/Psych negative neurological ROS  negative psych ROS   GI/Hepatic negative GI ROS, Neg liver ROS,   Endo/Other  negative endocrine ROS  Renal/GU negative Renal ROS  negative genitourinary   Musculoskeletal  (+) Arthritis , Osteoarthritis,    Abdominal   Peds  Hematology negative hematology ROS (+)   Anesthesia Other Findings   Reproductive/Obstetrics negative OB ROS                            Anesthesia Physical Anesthesia Plan  ASA: 3  Anesthesia Plan: General   Post-op Pain Management:  Regional for Post-op pain   Induction: Intravenous  PONV Risk Score and Plan: 3 and Ondansetron, Dexamethasone and Midazolam  Airway Management Planned: Oral ETT  Additional Equipment:   Intra-op Plan:   Post-operative Plan: Extubation in OR  Informed Consent: I have reviewed the patients History and Physical, chart, labs and discussed the procedure including the risks, benefits and alternatives for the proposed anesthesia with the patient or authorized representative who has indicated his/her understanding and acceptance.     Dental advisory given  Plan Discussed with: CRNA  Anesthesia Plan Comments:         Anesthesia Quick Evaluation

## 2021-01-02 NOTE — Discharge Instructions (Signed)
No Tylenol before 4:30pm if needed.  Post Anesthesia Home Care Instructions  Activity: Get plenty of rest for the remainder of the day. A responsible individual must stay with you for 24 hours following the procedure.  For the next 24 hours, DO NOT: -Drive a car -Paediatric nurse -Drink alcoholic beverages -Take any medication unless instructed by your physician -Make any legal decisions or sign important papers.  Meals: Start with liquid foods such as gelatin or soup. Progress to regular foods as tolerated. Avoid greasy, spicy, heavy foods. If nausea and/or vomiting occur, drink only clear liquids until the nausea and/or vomiting subsides. Call your physician if vomiting continues.  Special Instructions/Symptoms: Your throat may feel dry or sore from the anesthesia or the breathing tube placed in your throat during surgery. If this causes discomfort, gargle with warm salt water. The discomfort should disappear within 24 hours.  If you had a scopolamine patch placed behind your ear for the management of post- operative nausea and/or vomiting:  1. The medication in the patch is effective for 72 hours, after which it should be removed.  Wrap patch in a tissue and discard in the trash. Wash hands thoroughly with soap and water. 2. You may remove the patch earlier than 72 hours if you experience unpleasant side effects which may include dry mouth, dizziness or visual disturbances. 3. Avoid touching the patch. Wash your hands with soap and water after contact with the patch.      Regional Anesthesia Blocks  1. Numbness or the inability to move the "blocked" extremity may last from 3-48 hours after placement. The length of time depends on the medication injected and your individual response to the medication. If the numbness is not going away after 48 hours, call your surgeon.  2. The extremity that is blocked will need to be protected until the numbness is gone and the  Strength has  returned. Because you cannot feel it, you will need to take extra care to avoid injury. Because it may be weak, you may have difficulty moving it or using it. You may not know what position it is in without looking at it while the block is in effect.  3. For blocks in the legs and feet, returning to weight bearing and walking needs to be done carefully. You will need to wait until the numbness is entirely gone and the strength has returned. You should be able to move your leg and foot normally before you try and bear weight or walk. You will need someone to be with you when you first try to ensure you do not fall and possibly risk injury.  4. Bruising and tenderness at the needle site are common side effects and will resolve in a few days.  5. Persistent numbness or new problems with movement should be communicated to the surgeon or the [facility] [phone] [place] [phone]).    Information for Discharge Teaching: EXPAREL (bupivacaine liposome injectable suspension)   Your surgeon or anesthesiologist gave you EXPAREL(bupivacaine) to help control your pain after surgery.  EXPAREL is a local anesthetic that provides pain relief by numbing the tissue around the surgical site. EXPAREL is designed to release pain medication over time and can control pain for up to 72 hours. Depending on how you respond to EXPAREL, you may require less pain medication during your recovery.  Possible side effects: Temporary loss of sensation or ability to move in the area where bupivacaine was injected. Nausea,  vomiting, constipation Rarely, numbness and tingling in your mouth or lips, lightheadedness, or anxiety may occur. Call your doctor right away if you think you may be experiencing any of these sensations, or if you have other questions regarding possible side effects.  Follow all other discharge instructions given to you by your surgeon or nurse. Eat a healthy diet and  drink plenty of water or other fluids.  If you return to the hospital for any reason within 96 hours following the administration of EXPAREL, it is important for health care providers to know that you have received this anesthetic. A teal colored band has been placed on your arm with the date, time and amount of EXPAREL you have received in order to alert and inform your health care providers. Please leave this armband in place for the full 96 hours following administration, and then you may remove the band.

## 2021-01-02 NOTE — Anesthesia Postprocedure Evaluation (Signed)
Anesthesia Post Note  Patient: Vincent Mendoza  Procedure(s) Performed: SHOULDER ARTHROSCOPY WITH ROTATOR CUFF REPAIR AND SUBACROMIAL DECOMPRESSION, DISTAL CLAVICLE EXCISION (Right: Shoulder) BICEPS TENDON REPAIR (Shoulder)     Patient location during evaluation: PACU Anesthesia Type: General and Regional Level of consciousness: awake and alert Pain management: pain level controlled Vital Signs Assessment: post-procedure vital signs reviewed and stable Respiratory status: spontaneous breathing, nonlabored ventilation and respiratory function stable Cardiovascular status: blood pressure returned to baseline and stable Postop Assessment: no apparent nausea or vomiting Anesthetic complications: no   No notable events documented.  Last Vitals:  Vitals:   01/02/21 1430 01/02/21 1451  BP: 120/77 126/73  Pulse: 71 67  Resp: (!) 21 20  Temp:  36.7 C  SpO2: 94% 94%    Last Pain:  Vitals:   01/02/21 1451  TempSrc:   PainSc: 0-No pain                 Naidelin Gugliotta,W. EDMOND

## 2021-01-02 NOTE — Anesthesia Procedure Notes (Signed)
Anesthesia Regional Block: Interscalene brachial plexus block   Pre-Anesthetic Checklist: , timeout performed,  Correct Patient, Correct Site, Correct Laterality,  Correct Procedure, Correct Position, site marked,  Risks and benefits discussed,  Pre-op evaluation,  At surgeon's request and post-op pain management  Laterality: Right  Prep: Maximum Sterile Barrier Precautions used, chloraprep       Needles:  Injection technique: Single-shot  Needle Type: Echogenic Stimulator Needle     Needle Length: 5cm  Needle Gauge: 22     Additional Needles:   Procedures:, nerve stimulator,,, ultrasound used (permanent image in chart),,     Nerve Stimulator or Paresthesia:  Response: Biceps response  Additional Responses:   Narrative:  Start time: 01/02/2021 10:46 AM End time: 01/02/2021 10:56 AM Injection made incrementally with aspirations every 5 mL. Anesthesiologist: Roderic Palau, MD  Additional Notes: 2% Lidocaine skin wheel.

## 2021-01-02 NOTE — Anesthesia Procedure Notes (Signed)
Procedure Name: Intubation Date/Time: 01/02/2021 12:30 PM Performed by: Verita Lamb, CRNA Pre-anesthesia Checklist: Patient identified, Emergency Drugs available, Suction available and Patient being monitored Patient Re-evaluated:Patient Re-evaluated prior to induction Oxygen Delivery Method: Circle system utilized Preoxygenation: Pre-oxygenation with 100% oxygen Induction Type: IV induction Ventilation: Mask ventilation without difficulty Laryngoscope Size: Mac and 4 Grade View: Grade II Tube type: Oral Tube size: 7.0 mm Number of attempts: 1 Airway Equipment and Method: Stylet and Oral airway Placement Confirmation: ETT inserted through vocal cords under direct vision, positive ETCO2 and breath sounds checked- equal and bilateral Secured at: 21 cm Tube secured with: Tape Dental Injury: Teeth and Oropharynx as per pre-operative assessment

## 2021-01-02 NOTE — Transfer of Care (Signed)
Immediate Anesthesia Transfer of Care Note  Patient: Vincent Mendoza  Procedure(s) Performed: SHOULDER ARTHROSCOPY WITH ROTATOR CUFF REPAIR AND SUBACROMIAL DECOMPRESSION, DISTAL CLAVICLE EXCISION (Right: Shoulder) BICEPS TENDON REPAIR (Shoulder)  Patient Location: PACU  Anesthesia Type:General and Regional  Level of Consciousness: awake, alert  and oriented  Airway & Oxygen Therapy: Patient Spontanous Breathing and Patient connected to face mask oxygen  Post-op Assessment: Report given to RN and Post -op Vital signs reviewed and stable  Post vital signs: Reviewed and stable  Last Vitals:  Vitals Value Taken Time  BP    Temp    Pulse 74 01/02/21 1355  Resp    SpO2 99 % 01/02/21 1355  Vitals shown include unvalidated device data.  Last Pain:  Vitals:   01/02/21 1026  TempSrc: Oral  PainSc: 0-No pain      Patients Stated Pain Goal: 1 (85/63/14 9702)  Complications: No notable events documented.

## 2021-01-02 NOTE — Progress Notes (Signed)
Assisted Dr. Edmond Fitzgerald with right, ultrasound guided, interscalene  block. Side rails up, monitors on throughout procedure. See vital signs in flow sheet. Tolerated Procedure well. 

## 2021-01-03 NOTE — Op Note (Signed)
Vincent Mendoza, Vincent Mendoza MEDICAL RECORD NO: [id number] ACCOUNT NO: [id number] DATE OF BIRTH: 11-27-1955 FACILITY: [place] [facility] LOCATION: [facility] PHYSICIAN: Audree Camel. Noemi Chapel, MD  Operative Report   DATE OF PROCEDURE: 01/02/2021   PREOPERATIVE DIAGNOSIS:   1.  Right shoulder acute traumatic rotator cuff tear. 2.  Right shoulder acute traumatic labrum/biceps tear. 3.  Right shoulder chronic nontraumatic impingement.  POSTOPERATIVE DIAGNOSES:   1.  Right shoulder acute traumatic rotator cuff tear. 2.  Right shoulder acute traumatic labrum/biceps tear. 3.  Right shoulder chronic nontraumatic impingement.  PROCEDURES:   1.  Right shoulder EUA followed by arthroscopically assisted rotator cuff repair using Arthrex SwiveLock anchors x2 with double row repair. 2.  Right shoulder biceps tenodesis using Arthrex FiberTak anchors x2. 3.  Right shoulder labrum, debridement, partial biceps debridement and rotator cuff tear debridement - extensive. 4.  Right shoulder subacromial decompression.  SURGEON:  Audree Camel. Noemi Chapel, MD  ASSISTANT:  Matthew Saras, PA   ANESTHESIA:  General.    Operating time 1 hour.  COMPLICATIONS:  None.  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  Exam, x-rays and MRIs revealed rotator cuff tear, partial biceps and labrum tear with impingement.  He  has failed conservative care, and is now to undergo arthroscopy and repair.  DESCRIPTION:  The patient was brought to the operating room on 01/02/2021 after an interscalene block, placed in the holding room by Anesthesia.  He was placed on the operating table in supine position.  He received antibiotics preoperatively for  prophylaxis.  After being placed under general anesthesia, his right shoulder was examined.  He had a full range of motion and shoulder was stable on ligamentous exam.  He was then placed in a  beach chair position and his shoulder and arm was prepped  using sterile DuraPrep and draped using sterile technique.  Timeout procedure was called and the correct right shoulder identified.  Initially through a posterior arthroscopic portal, the arthroscope with a pump attached was placed into an anterior  portal, an arthroscopic probe was placed. On initial inspection the articular cartilage in the glenohumeral joint was intact.  The anterior labrum and anterior inferior glenohumeral ligament complex was intact.  Biceps tendon anchor was torn and  loosened, the biceps showed partial tearing as well 25% which was felt to be amenable to a biceps tenodesis.  Posterior labrum was partially torn 25%, which was debrided.  Rotator cuff showed a full tear of the supraspinatus and anterior 30-40% of the  infraspinatus and this was partially debrided arthroscopically.  The rest of the rotator cuff was intact.  At this point, the biceps tenodesis was carried out.  The medial aspect of the biceps was cut after it had been tagged and the superior labrum was  debrided back to a stable base.  Subacromial space was entered and a lateral arthroscopic portal was made.  Moderately thickened bursitis was resected.  Impingement was noted as the undersurface of the acromion was digging in the rotator cuff tear and  subacromial decompression was carried out as well as CA ligament release.  The [facility] joint was not impinging into the joint and thus was not resected.  At this point, through an accessory anterolateral portal, the biceps tenodesis was carried out, finding  the biceps in the bicipital groove extraarticular and then two separate FiberTak anchors were placed and each of the sutures were passed around the  biceps tendon and tied down tightly thus securing the tenodesis in place and then remaining stump was cut.   At this point, through 2 accessory lateral portals, the rotator cuff tear was repaired with one medial row  SwiveLock anchor.  The sutures in this anchor were placed in a mattress suture technique and then the medial row was tied down arthroscopically.   All these sutures were then passed into a lateral SwiveLock anchor, which was deployed in the lateral aspect of the greater tuberosity thus securing the rotator cuff tear back down into its anatomic position with firm and tight fixation.  Shoulder could  be brought through a full range of motion with no impingement on the repair.  At this point, it was felt that all pathology had been satisfactorily addressed.  Instruments were removed.  Portals were closed with 3-0 nylon suture.  Sterile dressings were  applied and a sling, and the patient was awakened and taken to recovery room in stable condition.  FOLLOWUP CARE: The patient will follow as an outpatient on oxycodone for pain.  I will see him back in office in a week for sutures out and followup.     Elián.Darby D: 01/03/2021 8:22:54 am T: 01/03/2021 9:09:00 am  JOB: [id number]/ [id number]

## 2021-01-04 ENCOUNTER — Encounter (HOSPITAL_BASED_OUTPATIENT_CLINIC_OR_DEPARTMENT_OTHER): Payer: Self-pay | Admitting: Orthopedic Surgery

## 2021-05-28 ENCOUNTER — Other Ambulatory Visit: Payer: Self-pay | Admitting: Internal Medicine

## 2021-05-28 MED ORDER — VASCEPA 1 G PO CAPS: 2.0000 g | ORAL_CAPSULE | Freq: Two times a day (BID) | ORAL | 3 refills | Status: DC

## 2021-08-06 ENCOUNTER — Ambulatory Visit: Payer: BC Managed Care – PPO | Admitting: Internal Medicine

## 2021-08-06 ENCOUNTER — Encounter: Payer: Self-pay | Admitting: Internal Medicine

## 2021-08-06 ENCOUNTER — Other Ambulatory Visit (HOSPITAL_COMMUNITY)
Admission: RE | Admit: 2021-08-06 | Discharge: 2021-08-06 | Disposition: A | Discharge status: Home / Self Care | Payer: BC Managed Care – PPO | Source: Ambulatory Visit | Attending: Internal Medicine | Admitting: Internal Medicine

## 2021-08-06 VITALS — BP 112/74 | HR 56 | Ht 70.0 in | Wt 194.0 lb

## 2021-08-06 DIAGNOSIS — I10 Essential (primary) hypertension: Secondary | ICD-10-CM | POA: Diagnosis not present

## 2021-08-06 DIAGNOSIS — R5383 Other fatigue: Secondary | ICD-10-CM | POA: Insufficient documentation

## 2021-08-06 DIAGNOSIS — G4719 Other hypersomnia: Secondary | ICD-10-CM | POA: Diagnosis not present

## 2021-08-06 DIAGNOSIS — N528 Other male erectile dysfunction: Secondary | ICD-10-CM

## 2021-08-06 DIAGNOSIS — E782 Mixed hyperlipidemia: Secondary | ICD-10-CM

## 2021-08-06 DIAGNOSIS — G4733 Obstructive sleep apnea (adult) (pediatric): Secondary | ICD-10-CM

## 2021-08-06 DIAGNOSIS — I2510 Atherosclerotic heart disease of native coronary artery without angina pectoris: Secondary | ICD-10-CM

## 2021-08-06 MED ORDER — SILDENAFIL CITRATE 50 MG PO TABS: 50.0000 mg | ORAL_TABLET | ORAL | 3 refills | Status: DC | PRN

## 2021-08-06 MED ORDER — METOPROLOL TARTRATE 25 MG PO TABS
12.5000 mg | ORAL_TABLET | Freq: Two times a day (BID) | ORAL | 3 refills | Status: AC
Start: 2021-08-06 — End: ?

## 2021-08-06 MED ORDER — SILDENAFIL CITRATE 50 MG PO TABS: 50.0000 mg | ORAL_TABLET | ORAL | 1 refills | Status: DC | PRN

## 2021-08-06 NOTE — Progress Notes (Signed)
OFFICE CONSULT NOTE  Chief Complaint:  Follow-up lipids, complaints of fatigue, ED, non-restorative sleep  Primary Care Physician: Prince Solian, MD  HPI:  Vincent Mendoza is a 66 y.o. male who is being seen today for the evaluation of recent NSTEMI at the request of Avva, Ravisankar, MD. 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  According to his records, he presented at [facility] after awakening from his dead sleep at 3 AM.  He had left-sided shoulder pain radiating anteriorly of the mid chest and neck.  He said it lasted for more than 10 minutes was noted to be about a 4 out of 10 chest pain.  He presented to the emergency department and initial troponin was elevated 0.1.  Subsequent troponins were 0.35 and 0.63.  He was diagnosed with a non-STEMI and treated appropriately.  He did undergo left heart catheterization on 04/20/2018 at [facility].  This demonstrated an 80% proximal LAD stenosis and a 40% distal LAD stenosis.  There was a 50% mid RCA lesion as well.  Subsequently he underwent balloon angioplasty and placement of a 2.5 x 18 mm Onyx resolute drug-eluting stent.  This was postdilated with a 3.0 x 15 mm Quantum apex balloon.  TIMI-3 flow was noted.  He had an echocardiogram as well which showed normal systolic function, mild LVH, very mild hypokinesis of the basal anterior mid anterior and apical anterior walls.  Grade 1 diastolic dysfunction was noted with mild aortic insufficiency.  Since stenting he is done well.  He is got some occasional sharp chest discomfort but no further anginal symptoms.  He has started walking more regularly and says he is committed to changing his dietary habits.  Although he has a history of dyslipidemia he had been on high potency statin therapy with Crestor 20 mg daily.  During a recent physical with his PCP, his labs indicated total cholesterol 158, HDL 50, LDL 79 and triglycerides 147.  He had repeat  labs during his hospitalization which showed total cholesterol of 130, triglycerides 197, HDL 42 and LDL of 49.  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  Overall he says he is doing well.  He is doing home rehabilitation.  Occasionally gets some pain in his left shoulder area which is reminiscent of his anginal symptoms when he starts exercising however does dissipate with more exercise.  He denies any worsening shortness of breath in fact his exercise is getting a little easier.  Recently though he has not been exercising as much due to stress at work.  He hopes to get back to that.  He was successfully started on Vascepa.  We have not yet had repeat lipids.  He plans to see his primary care provider in December will have lab work at that time which I will review.  Most likely we could consider coming off of dual antiplatelet therapy next February.  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 Overall he is doing very well. He denies chest pain or shortness of breath. He is 1 year post-PCI. At this point, I feel we can discontinue his Plavix. His only real complaint is ED - he wonders if the metoprolol may be contributing to that. It is possible, but in my experience of using it frequently over many years, it is less likely.  He did bring this up to his urologist who had prescribed him Viagra but he has not used it consistently.  I feel great importance of using beta-blockers in patients with non-STEMI and coronary artery disease given multiple cardiovascular benefits.  He was advised not to use nitrates and Viagra concomitantly.  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  He has no significant complaints.  He gets occasional chest discomfort but is not necessarily with exertion or relieved by rest.  He has not used nitroglycerin and his prescriptions now 64 years old.  His blood pressure is excellent today at 122/74.  Labs in January showed a marked improvement in his  cholesterol with total 129, HDL 44, LDL 61 and triglycerides 119.  EKG shows a sinus bradycardia at 58 without ischemia.  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  No symptoms of angina or worsening shortness of breath.  Had ongoing lab work through his primary care provider, last in February 2023 which showed total cholesterol 129, triglycerides 112, HDL 36 and LDL 71.  His main concerns today however related to fatigue.  This has been a longstanding issue, in fact he underwent a sleep study for further evaluation of this back in 2020.  This was interpreted at [facility] neurologic as mild sleep apnea.  He was supposed to have an in lab titration but never followed up with that and is not wearing his CPAP.  In addition he struggled with erectile dysfunction.  He has had some success with sildenafil however reports it does not always work and he has issues with headaches.  He has not had assessment of his testosterone to my knowledge.  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  He also has tick exposure on his property.  He had requested evaluation for unusual tickborne illnesses.  PMHx:  Past Medical History:  Diagnosis Date   Acromioclavicular joint arthritis 12/13/2020   Diverticulosis    Gout    Hyperlipidemia    Hypertension    stopped taking Lisinopril after a week   Impingement syndrome of right shoulder 12/13/2020   Inguinal hernia    right   Kidney stone    Morton's neuroma of left foot    Myocardial infarction [facility]) 2020   with stent   Sleep apnea    Wears glasses     Past Surgical History:  Procedure Laterality Date   cardiac stent placement     COLONOSCOPY  2018   EXCISION MORTON'S NEUROMA Left 03/20/2017   Procedure: Left 3-4 Morton's Neuroma Excision;  Surgeon: Wylene Simmer, MD;  Location: [place];  Service: Orthopedics;  Laterality: Left;   INGUINAL HERNIA REPAIR Bilateral  12/29/2014   Procedure: LAPAROSCOPIC BILATERAL INGUINAL HERNIA REPAIR;  Surgeon: Ralene Ok, MD;  Location: [place];  Service: General;  Laterality: Bilateral;   INSERTION OF MESH Right 12/29/2014   Procedure: INSERTION OF MESH;  Surgeon: Ralene Ok, MD;  Location: [place];  Service: General;  Laterality: Right;   LAPAROSCOPY N/A 12/29/2014   Procedure: LAPAROSCOPY DIAGNOSTIC;  Surgeon: Ralene Ok, MD;  Location: [place];  Service: General;  Laterality: N/A;   LITHOTRIPSY     x3   LIVER BIOPSY N/A 12/29/2014   Procedure: LIVER BIOPSY;  Surgeon: Ralene Ok, MD;  Location: [place];  Service: General;  Laterality: N/A;   SHOULDER ARTHROSCOPY WITH BICEPS TENDON REPAIR  01/02/2021   Procedure: BICEPS TENDON REPAIR;  Surgeon: Elsie Saas, MD;  Location: [place];  Service: Orthopedics;;   SHOULDER ARTHROSCOPY WITH ROTATOR CUFF  REPAIR AND SUBACROMIAL DECOMPRESSION Right 01/02/2021   Procedure: SHOULDER ARTHROSCOPY WITH ROTATOR CUFF REPAIR AND SUBACROMIAL DECOMPRESSION, DISTAL CLAVICLE EXCISION;  Surgeon: Elsie Saas, MD;  Location: [place];  Service: Orthopedics;  Laterality: Right;   TONSILLECTOMY     VASECTOMY      FAMHx:  Family History  Problem Relation Age of Onset   Heart disease Mother    Heart disease Father    Diabetes Mellitus II Father    Pancreatic cancer Maternal Aunt    Heart disease Maternal Grandfather    Heart disease Paternal Grandfather    Colon cancer Neg Hx    Colon polyps Neg Hx    Esophageal cancer Neg Hx    Stomach cancer Neg Hx    Rectal cancer Neg Hx     SOCHx:   reports that he has never smoked. He has never used smokeless tobacco. He reports current alcohol use of about 7.0 standard drinks of alcohol per week. He reports that he does not use drugs.  ALLERGIES:  Allergies  Allergen Reactions   Lisinopril Cough    ROS: Pertinent items noted in HPI and remainder of comprehensive ROS otherwise  negative.  HOME MEDS: Current Outpatient Medications on File Prior to Visit  Medication Sig Dispense Refill   allopurinol (ZYLOPRIM) 100 MG tablet Take 100 mg by mouth daily.     aspirin EC 81 MG tablet Take 81 mg by mouth daily.     hydrochlorothiazide (MICROZIDE) 12.5 MG capsule Take 12.5 mg by mouth daily.     losartan (COZAAR) 100 MG tablet Take 100 mg by mouth daily.     metoprolol tartrate (LOPRESSOR) 25 MG tablet Take 25 mg by mouth 2 (two) times daily.     nitroGLYCERIN (NITROSTAT) 0.4 MG SL tablet Place 1 tablet (0.4 mg total) under the tongue every 5 (five) minutes as needed for chest pain. 25 tablet 3   potassium citrate (UROCIT-K) 10 MEQ (1080 MG) SR tablet Take 10 mEq by mouth daily at 6 (six) AM.     rosuvastatin (CRESTOR) 20 MG tablet Take 20 mg by mouth daily.     sildenafil (VIAGRA) 50 MG tablet Take 50 mg by mouth as needed for erectile dysfunction.     VASCEPA 1 g capsule Take 2 capsules (2 g total) by mouth 2 (two) times daily. 360 capsule 3   No current facility-administered medications on file prior to visit.    LABS/IMAGING: No results found for this or any previous visit (from the past 48 hour(s)). No results found.  LIPID PANEL: No results found for: "CHOL", "TRIG", "HDL", "CHOLHDL", "VLDL", "LDLCALC", "LDLDIRECT"  WEIGHTS: Wt Readings from Last 3 Encounters:  08/06/21 194 lb (88 kg)  01/02/21 189 lb 13.1 oz (86.1 kg)  05/22/20 196 lb 12.8 oz (89.3 kg)    VITALS: BP 112/74   Pulse (!) 56   Ht '5\' 10"'$  (1.778 m)   Wt 194 lb (88 kg)   SpO2 95%   BMI 27.84 kg/m   EXAM: General appearance: alert and no distress Neck: no carotid bruit, no JVD and thyroid not enlarged, symmetric, no tenderness/mass/nodules Lungs: clear to auscultation bilaterally Heart: regular rate and rhythm, S1, S2 normal and diastolic murmur: early diastolic 2/6, blowing at 2nd right intercostal space Abdomen: soft, non-tender; bowel sounds normal; no masses,  no  organomegaly Extremities: extremities normal, atraumatic, no cyanosis or edema Pulses: 2+ and symmetric Skin: Skin color, texture, turgor normal. No rashes or lesions Neurologic: Grossly normal  Psych: Pleasant  EKG: Deferred  ASSESSMENT: NSTEMI CAD status post DES to the proximal LAD with residual 50% mid RCA disease and distal LAD disease (04/20/2018) Dyslipidemia Hypertension Family history of coronary disease in both parents Fatigue Mild OSA-not on CPAP Erectile dysfunction  PLAN: 1.   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  His cholesterol is reasonably well controlled and blood pressure is as well.  He felt that fatigue is his main issue as well as erectile dysfunction.  He has nonrestorative sleep.  He has been diagnosed with sleep apnea, albeit mild but never went back for CPAP titration.  He seems agreeable to this that he is understanding that it may be contributing to his fatigue.  I will refer him back to [facility] neurologic.  They may be able to set him up with an AutoPap.  In addition he is concerned about his heart rate being low in the 50s.  I advised we decrease his metoprolol to 12 and half milligrams twice a day to see if this is helpful.  He also has numerous joint aches which she thought could be related to his statin.  I advised a 2-week statin holiday to start a few weeks after the metoprolol dose adjustment to see if this improves some of these pains.  He then had concerns about possible tickborne illnesses due to his profession and home exposure to ticks.  I advised that he may wish to seek out an infectious disease expert to do further testing for this.  Finally erectile dysfunction continues to be an issue.  He had some benefit from sildenafil 50 mg but did have issues with headaches.  I will renew that and allow him to take up to 100 mg if needed per day but not with nitroglycerin.  We removed sublingual nitroglycerin from his list to  avoid any interaction.  We will also order a testosterone panel to see if this may also be contributing to his issues with fatigue.  Plan follow-up with me in a few months.  TIME SPENT WITH PATIENT: 40 minutes of direct patient care. More than 50% of that time was spent on coordination of care and counseling regarding coronary artery disease, hypertension, dyslipidemia, OSA, fatigue, erectile dysfunction.  Pixie Casino, MD, [facility], [place] Director of the Advanced Lipid Disorders &  Cardiovascular Risk Reduction Clinic Diplomate of the American Board of Clinical Lipidology Attending Cardiologist  Direct Dial: [phone]  Fax: [phone]  Website:  [email or web address] 08/06/2021, 1:56 PM

## 2021-08-06 NOTE — Patient Instructions (Signed)
Medication Instructions:  DECREASE Lopressor to 12.5 mg twice a day   Take a statin holiday for 2 weeks in July  Labwork: Testosterone level   Testing/Procedures: Referral placed to [facility] to get CPAP  Follow-Up: 3 months  Any Other Special Instructions Will Be Listed Below (If Applicable).  If you need a refill on your cardiac medications before your next appointment, please call your pharmacy.

## 2021-08-09 LAB — TESTOSTERONE, FREE, TOTAL, SHBG
Sex Hormone Binding: 46.1 nmol/L (ref 19.3–76.4)
Testosterone, Free: 10 pg/mL (ref 6.6–18.1)
Testosterone: 332 ng/dL (ref 264–916)

## 2021-08-19 ENCOUNTER — Telehealth: Payer: Self-pay

## 2021-08-19 ENCOUNTER — Ambulatory Visit
Admission: EM | Admit: 2021-08-19 | Discharge: 2021-08-19 | Disposition: A | Discharge status: Home / Self Care | Payer: BC Managed Care – PPO | Attending: Nurse Practitioner | Admitting: Nurse Practitioner

## 2021-08-19 DIAGNOSIS — U071 COVID-19: Secondary | ICD-10-CM | POA: Diagnosis not present

## 2021-08-19 MED ORDER — PROMETHAZINE-DM 6.25-15 MG/5ML PO SYRP: 5.0000 mL | ORAL_SOLUTION | Freq: Four times a day (QID) | ORAL | 0 refills | Status: DC | PRN

## 2021-08-19 MED ORDER — FLUTICASONE PROPIONATE 50 MCG/ACT NA SUSP
2.0000 | Freq: Every day | NASAL | 0 refills | Status: DC
Start: 2021-08-19 — End: 2021-08-19

## 2021-08-19 MED ORDER — MOLNUPIRAVIR EUA 200MG CAPSULE: 4.0000 | ORAL_CAPSULE | Freq: Two times a day (BID) | ORAL | 0 refills | Status: DC

## 2021-08-19 MED ORDER — MOLNUPIRAVIR EUA 200MG CAPSULE: 4.0000 | ORAL_CAPSULE | Freq: Two times a day (BID) | ORAL | 0 refills | Status: AC

## 2021-08-19 MED ORDER — FLUTICASONE PROPIONATE 50 MCG/ACT NA SUSP: 2.0000 | Freq: Every day | NASAL | 0 refills | Status: DC

## 2021-08-19 NOTE — ED Provider Notes (Signed)
[facility]    CSN: [id number] Arrival date & time: 08/19/21  1135      History   Chief Complaint Chief Complaint  Patient presents with   Nasal Congestion    + COVID test today    Headache    HPI Vincent Mendoza is a 66 y.o. male.   The history is provided by the patient.   Presents for upper respiratory symptoms that started over the past 24 hours.  Patient complains of nasal congestion, headache, cough, and runny nose.  Patient states that he recently traveled out of town and returned last evening.  He denies fever, chills, wheezing, shortness of breath, difficulty breathing, or GI symptoms.  Patient states that he took a home COVID test today, which was positive.  Patient reports he has had 3 COVID vaccines.  Past Medical History:  Diagnosis Date   Acromioclavicular joint arthritis 12/13/2020   Diverticulosis    Gout    Hyperlipidemia    Hypertension    stopped taking Lisinopril after a week   Impingement syndrome of right shoulder 12/13/2020   Inguinal hernia    right   Kidney stone    Morton's neuroma of left foot    Myocardial infarction [facility]) 2020   with stent   Sleep apnea    Wears glasses     Patient Active Problem List   Diagnosis Date Noted   Traumatic rotator cuff tear, right, subsequent encounter 12/13/2020   Impingement syndrome of right shoulder 12/13/2020   Acromioclavicular joint arthritis 12/13/2020   Erythrocytosis 11/18/2018   Non-restorative sleep 11/18/2018   Coronary artery disease involving native coronary artery of native heart with angina pectoris with documented spasm (HCC) 11/18/2018   Status post angioplasty with stent 11/18/2018   Excessive daytime sleepiness 11/18/2018   Snoring 11/18/2018   Hip pain, acute, unspecified laterality 11/18/2018   Diverticulitis of colon 05/18/2014    Past Surgical History:  Procedure Laterality Date   cardiac stent placement     COLONOSCOPY  2018   EXCISION MORTON'S NEUROMA  Left 03/20/2017   Procedure: Left 3-4 Morton's Neuroma Excision;  Surgeon: Toni Arthurs, MD;  Location: [facility];  Service: Orthopedics;  Laterality: Left;   INGUINAL HERNIA REPAIR Bilateral 12/29/2014   Procedure: LAPAROSCOPIC BILATERAL INGUINAL HERNIA REPAIR;  Surgeon: Axel Filler, MD;  [facility];  Service: General;  Laterality: Bilateral;   INSERTION OF MESH Right 12/29/2014   Procedure: INSERTION OF MESH;  Surgeon: Axel Filler, MD;  [facility];  Service: General;  Laterality: Right;   LAPAROSCOPY N/A 12/29/2014   Procedure: LAPAROSCOPY DIAGNOSTIC;  Surgeon: Axel Filler, MD;  [facility];  Service: General;  Laterality: N/A;   LITHOTRIPSY     x3   LIVER BIOPSY N/A 12/29/2014   Procedure: LIVER BIOPSY;  Surgeon: Axel Filler, MD;  [facility];  Service: General;  Laterality: N/A;   SHOULDER ARTHROSCOPY WITH BICEPS TENDON REPAIR  01/02/2021   Procedure: BICEPS TENDON REPAIR;  Surgeon: Salvatore Marvel, MD;  Location: [facility];  Service: Orthopedics;;   SHOULDER ARTHROSCOPY WITH ROTATOR CUFF REPAIR AND SUBACROMIAL DECOMPRESSION Right 01/02/2021   Procedure: SHOULDER ARTHROSCOPY WITH ROTATOR CUFF REPAIR AND SUBACROMIAL DECOMPRESSION, DISTAL CLAVICLE EXCISION;  Surgeon: Salvatore Marvel, MD;  Location: [facility];  Service: Orthopedics;  Laterality: Right;   TONSILLECTOMY     VASECTOMY         Home Medications    Prior to Admission medications  Medication Sig Start Date End Date Taking? Authorizing Provider  promethazine-dextromethorphan (PROMETHAZINE-DM) 6.25-15 MG/5ML syrup Take 5 mLs by mouth 4 (four) times daily as needed for cough. 08/19/21  Yes Leath-Warren, Sadie Haber, NP  allopurinol (ZYLOPRIM) 100 MG tablet Take 100 mg by mouth daily.    [provider]  aspirin EC 81 MG tablet Take 81 mg by mouth daily.    [provider]  fluticasone (FLONASE) 50 MCG/ACT nasal spray Place 2 sprays into  both nostrils daily. 08/19/21   Leath-Warren, Sadie Haber, NP  hydrochlorothiazide (MICROZIDE) 12.5 MG capsule Take 12.5 mg by mouth daily.    [provider]  losartan (COZAAR) 100 MG tablet Take 100 mg by mouth daily.    [provider]  metoprolol tartrate (LOPRESSOR) 25 MG tablet Take 0.5 tablets (12.5 mg total) by mouth 2 (two) times daily. 08/06/21   Hilty, Lisette Abu, MD  molnupiravir EUA (LAGEVRIO) 200 mg CAPS capsule Take 4 capsules (800 mg total) by mouth 2 (two) times daily for 5 days. 08/19/21 08/24/21  Leath-Warren, Sadie Haber, NP  potassium citrate (UROCIT-K) 10 MEQ (1080 MG) SR tablet Take 10 mEq by mouth daily at 6 (six) AM.    [provider]  rosuvastatin (CRESTOR) 20 MG tablet Take 20 mg by mouth daily.    [provider]  sildenafil (VIAGRA) 50 MG tablet Take 1 tablet (50 mg total) by mouth as needed for erectile dysfunction (1-2 tablets per day as needed). 08/06/21   Hilty, Lisette Abu, MD  VASCEPA 1 g capsule Take 2 capsules (2 g total) by mouth 2 (two) times daily. 05/28/21 05/23/22  Chrystie Nose, MD    Family History Family History  Problem Relation Age of Onset   Heart disease Mother    Heart disease Father    Diabetes Mellitus II Father    Pancreatic cancer Maternal Aunt    Heart disease Maternal Grandfather    Heart disease Paternal Grandfather    Colon cancer Neg Hx    Colon polyps Neg Hx    Esophageal cancer Neg Hx    Stomach cancer Neg Hx    Rectal cancer Neg Hx     Social History Social History   Tobacco Use   Smoking status: Never   Smokeless tobacco: Never  Vaping Use   Vaping Use: Never used  Substance Use Topics   Alcohol use: Yes    Alcohol/week: 7.0 standard drinks of alcohol    Types: 7 Standard drinks or equivalent per week    Comment: occassional    Drug use: No     Allergies   Lisinopril   Review of Systems Review of Systems Per HPI  Physical Exam Triage Vital Signs ED Triage Vitals  Enc Vitals  Group     BP 08/19/21 1325 122/81     Pulse Rate 08/19/21 1325 75     Resp 08/19/21 1325 18     Temp 08/19/21 1325 98.1 F (36.7 C)     Temp Source 08/19/21 1325 Oral     SpO2 08/19/21 1325 97 %     Weight --      Height --      Head Circumference --      Peak Flow --      Pain Score 08/19/21 1324 7     Pain Loc --      Pain Edu? --      Excl. in GC? --    No data found.  Updated  Vital Signs BP 122/81 (BP Location: Right Arm)   Pulse 75   Temp 98.1 F (36.7 C) (Oral)   Resp 18   SpO2 97%   Visual Acuity Right Eye Distance:   Left Eye Distance:   Bilateral Distance:    Right Eye Near:   Left Eye Near:    Bilateral Near:     Physical Exam Vitals and nursing note reviewed.  Constitutional:      General: He is not in acute distress.    Appearance: Normal appearance. He is well-developed.  HENT:     Head: Normocephalic and atraumatic.     Right Ear: Tympanic membrane, ear canal and external ear normal.     Left Ear: Tympanic membrane, ear canal and external ear normal.     Nose: Congestion and rhinorrhea present. Rhinorrhea is clear.     Right Turbinates: Enlarged and swollen.     Left Turbinates: Enlarged and swollen.     Right Sinus: No maxillary sinus tenderness or frontal sinus tenderness.     Left Sinus: No maxillary sinus tenderness or frontal sinus tenderness.     Mouth/Throat:     Mouth: Mucous membranes are moist.  Eyes:     Conjunctiva/sclera: Conjunctivae normal.     Pupils: Pupils are equal, round, and reactive to light.  Neck:     Thyroid: No thyromegaly.     Trachea: No tracheal deviation.  Cardiovascular:     Rate and Rhythm: Regular rhythm.     Heart sounds: Normal heart sounds.  Pulmonary:     Effort: Pulmonary effort is normal.     Breath sounds: Normal breath sounds.  Abdominal:     General: Bowel sounds are normal. There is no distension.     Palpations: Abdomen is soft.     Tenderness: There is no abdominal tenderness.   Musculoskeletal:     Cervical back: Normal range of motion and neck supple.  Skin:    General: Skin is warm and dry.  Neurological:     General: No focal deficit present.     Mental Status: He is alert and oriented to person, place, and time.  Psychiatric:        Behavior: Behavior normal.        Thought Content: Thought content normal.        Judgment: Judgment normal.      UC Treatments / Results  Labs (all labs ordered are listed, but only abnormal results are displayed) Labs Reviewed  COVID-19, FLU A+B NAA    EKG   Radiology No results found.  Procedures Procedures (including critical care time)  Medications Ordered in UC Medications - No data to display  Initial Impression / Assessment and Plan / UC Course  I have reviewed the triage vital signs and the nursing notes.  Pertinent labs & imaging results that were available during my care of the patient were reviewed by me and considered in my medical decision making (see chart for details).  Patient presents for upper respiratory symptoms that started last evening.  Patient took a COVID test which was positive today.  Patient informs he has had recent travel.  On exam, patient's vital signs are stable, he is in no acute distress, his exam is reassuring.  COVID/flu test is pending.  Given the positive pregnancy test that he had at home, will start patient on molnupiravir.  Patient does not have any recent lab work in his chart, so we will use this medication instead  of Paxlovid.  Supportive care recommendations were provided to the patient.  Discussion with patient regarding isolation precautions.  Patient advised to follow-up in this clinic or with his primary care physician as needed. Final Clinical Impressions(s) / UC Diagnoses   Final diagnoses:  Positive self-administered antigen test for COVID-19  COVID-19     Discharge Instructions      COVID/flu test is pending.  You will also be able to see the results  in your MyChart account. Take medication as prescribed. Increase fluids and allow for plenty of rest. As discussed, if you are experiencing a fever, please remain isolated until resolved. Take antiviral for 5 days.  Once you complete the antiviral, if you are continuing to have symptoms, you will need to continue to wear your mask. Go to the emergency department if you develop extreme fatigue, difficulty breathing, trouble breathing, inability to speak in a complete sentence, or other concerns.     ED Prescriptions     Medication Sig Dispense Auth. Provider   molnupiravir EUA (LAGEVRIO) 200 mg CAPS capsule Take 4 capsules (800 mg total) by mouth 2 (two) times daily for 5 days. 40 capsule Leath-Warren, Sadie Haber, NP   fluticasone (FLONASE) 50 MCG/ACT nasal spray Place 2 sprays into both nostrils daily. 16 g Leath-Warren, Sadie Haber, NP   promethazine-dextromethorphan (PROMETHAZINE-DM) 6.25-15 MG/5ML syrup Take 5 mLs by mouth 4 (four) times daily as needed for cough. 140 mL Leath-Warren, Sadie Haber, NP      PDMP not reviewed this encounter.   Abran Cantor, NP 08/19/21 1606

## 2021-08-21 LAB — COVID-19, FLU A+B NAA
Influenza A, NAA: NOT DETECTED
Influenza B, NAA: NOT DETECTED
SARS-CoV-2, NAA: DETECTED — AB

## 2021-10-08 ENCOUNTER — Ambulatory Visit (INDEPENDENT_AMBULATORY_CARE_PROVIDER_SITE_OTHER): Payer: BC Managed Care – PPO | Admitting: Neurology

## 2021-10-08 ENCOUNTER — Encounter: Payer: Self-pay | Admitting: Neurology

## 2021-10-08 VITALS — BP 118/64 | HR 56 | Ht 70.0 in | Wt 194.0 lb

## 2021-10-08 DIAGNOSIS — G4719 Other hypersomnia: Secondary | ICD-10-CM

## 2021-10-08 DIAGNOSIS — Z95820 Peripheral vascular angioplasty status with implants and grafts: Secondary | ICD-10-CM

## 2021-10-08 DIAGNOSIS — G478 Other sleep disorders: Secondary | ICD-10-CM

## 2021-10-08 DIAGNOSIS — I25111 Atherosclerotic heart disease of native coronary artery with angina pectoris with documented spasm: Secondary | ICD-10-CM | POA: Diagnosis not present

## 2021-10-08 DIAGNOSIS — R0683 Snoring: Secondary | ICD-10-CM

## 2021-10-08 DIAGNOSIS — D751 Secondary polycythemia: Secondary | ICD-10-CM

## 2021-10-08 MED ORDER — FEXOFENADINE HCL 60 MG PO TABS
60.0000 mg | ORAL_TABLET | Freq: Two times a day (BID) | ORAL | 3 refills | Status: AC
Start: 2021-10-08 — End: ?

## 2021-10-08 NOTE — Patient Instructions (Signed)
Quality Sleep Information, Adult Quality sleep is important for your mental and physical health. It also improves your quality of life. Quality sleep means you: Are asleep for most of the time you are in bed. Fall asleep within 30 minutes. Wake up no more than once a night. Are awake for no longer than 20 minutes if you do wake up during the night. Most adults need 7-8 hours of quality sleep each night. How can poor sleep affect me? If you do not get enough quality sleep, you may have: Mood swings. Daytime sleepiness. Decreased alertness, reaction time, and concentration. Sleep disorders, such as insomnia and sleep apnea. Difficulty with: Solving problems. Coping with stress. Paying attention. These issues may affect your performance and productivity at work, school, and home. Lack of sleep may also put you at higher risk for accidents, suicide, and risky behaviors. If you do not get quality sleep, you may also be at higher risk for several health problems, including: Infections. Type 2 diabetes. Heart disease. High blood pressure. Obesity. Worsening of long-term conditions, like arthritis, kidney disease, depression, Parkinson's disease, and epilepsy. What actions can I take to get more quality sleep? Sleep schedule and routine Stick to a sleep schedule. Go to sleep and wake up at about the same time each day. Do not try to sleep less on weekdays and make up for lost sleep on weekends. This does not work. Limit naps during the day to 30 minutes or less. Do not take naps in the late afternoon. Make time to relax before bed. Reading, listening to music, or taking a hot bath promotes quality sleep. Make your bedroom a place that promotes quality sleep. Keep your bedroom dark, quiet, and at a comfortable room temperature. Make sure your bed is comfortable. Avoid using electronic devices that give off bright blue light for 30 minutes before bedtime. Your brain perceives bright blue light  as sunlight. This includes television, phones, and computers. If you are lying awake in bed for longer than 20 minutes, get up and do a relaxing activity until you feel sleepy. Lifestyle     Try to get at least 30 minutes of exercise on most days. Do not exercise 2-3 hours before going to bed. Do not use any products that contain nicotine or tobacco. These products include cigarettes, chewing tobacco, and vaping devices, such as e-cigarettes. If you need help quitting, ask your health care provider. Do not drink caffeinated beverages for at least 8 hours before going to bed. Coffee, tea, and some sodas contain caffeine. Do not drink alcohol or eat large meals close to bedtime. Try to get at least 30 minutes of sunlight every day. Morning sunlight is best. Medical concerns Work with your health care provider to treat medical conditions that may affect sleeping, such as: Nasal obstruction. Snoring. Sleep apnea and other sleep disorders. Talk to your health care provider if you think any of your prescription medicines may cause you to have difficulty falling or staying asleep. If you have sleep problems, talk with a sleep consultant. If you think you have a sleep disorder, talk with your health care provider about getting evaluated by a specialist. Where to find more information Sleep Foundation: sleepfoundation.org American Academy of Sleep Medicine: aasm.org Centers for Disease Control and Prevention (CDC): StoreMirror.com.cy Contact a health care provider if: You have trouble getting to sleep or staying asleep. You often wake up very early in the morning and cannot get back to sleep. You have daytime sleepiness. You  have daytime sleep attacks of suddenly falling asleep and sudden muscle weakness (narcolepsy). You have a tingling sensation in your legs with a strong urge to move your legs (restless legs syndrome). You stop breathing briefly during sleep (sleep apnea). You think you have a sleep  disorder or are taking a medicine that is affecting your quality of sleep. Summary Most adults need 7-8 hours of quality sleep each night. Getting enough quality sleep is important for your mental and physical health. Make your bedroom a place that promotes quality sleep, and avoid things that may cause you to have poor sleep, such as alcohol, caffeine, smoking, or large meals. Talk to your health care provider if you have trouble falling asleep or staying asleep. This information is not intended to replace advice given to you by your health care provider. Make sure you discuss any questions you have with your health care provider. Document Revised: 06/06/2021 Document Reviewed: 06/06/2021 Elsevier Patient Education  2023 Elsevier Inc.  

## 2021-10-08 NOTE — Progress Notes (Signed)
SLEEP MEDICINE CLINIC    Provider:  Larey Seat, MD  Primary Care Physician:  [place], [place] [address]     Referring Provider: Pixie Casino, [place] [address] [place],  [place] [address]          Chief Complaint according to patient   Patient presents with:     New Patient (Initial Visit)     complains of inconsistant sleep patterns that are leading to fatigue during the day. never had a sleep study      HISTORY OF PRESENT ILLNESS:  Vincent Mendoza is a 66 y.o. year old Caucasian male patient seen on 10/08/2021 upon a re- referral from Dr. Dagmar Hait  for a sleep consultation.  we met in 2020  during the pandemic and the patient was lost in follow up- 11-18-2018 notes are below.This patient was seen on 11/18/2018 upon referral from Dr. Dagmar Hait for a sleep consultation.  Chief concern according to patient: "concerns of inconsistent sleep patterns that began years ago- These are leading to more fatigue during the day. Difficulties maintaining sleep and quality of sleep has declined."  The patient reports hypersomnia, and will easily fall asleep when not stimulated or physically active.    I have the pleasure of seeing Dr. [name] again , on 10-08-2021: , a right-handed Caucasian 49- year- old male with a medical history of: Diverticulosis, Gout, Hyperlipidemia, Hypertension, Inguinal hernia, Kidney stone, Morton's neuroma of left foot, and has a history of CAD and angioplasty with stent placement, in Feb 2020. On beta blocker. He has higher erythrocyte counts and therefor needs checking for hypoxia. Gout is reportedly well controlled. he had rotator cuff surgery 12-26-2020,  he underwent a sleep test 12-15-2018, resulting in mild hypoxemia and mild apnea diagnosis: The total APNEA/HYPOPNEA INDEX (AHI) was 8.0 /hour.  25 events occurred in REM sleep and 31 events in NREM. The REM AHI was 19.5 /hour, versus a non-REM AHI of  4.3/h. The patient spent 102 minutes of total sleep time in the supine position and 213 minutes in non-supine. The supine AHI was 22.4/h versus a non-supine AHI of 1.1/h.   The [facility] stage 02 saturation was 98%, with the nadir being 80%. Time spent below 89% saturation equaled 15 minutes. Most hypoxemia clustered in REM sleep and in supine position.     Dr. Georgiann Hahn. Vet  [name]  has a past medical history of Acromioclavicular joint arthritis (12/13/2020), Diverticulosis, Gout, Hyperlipidemia, Hypertension, Impingement syndrome of right shoulder (12/13/2020), Inguinal hernia, Kidney stone, Morton's neuroma of left foot, Myocardial infarction ([place]) (2020), Sleep apnea, and Wears glasses.. Sleep relevant medical history: No nocturia, no  Parasomnia , he had a Tonsillectomy in childhood-no cervical spine surgery/trauma, no deviated septum. He is reportedly a loud snorer and light sleeper,who tries to avoid  supine sleep.    Family medical /sleep history: Father is DM, Obese, alive at age 1. Mother alive at age 35 with CAD. Father was on CPAP for OSA.    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 He lives in a household with  Spouse- Family status is married , with 2 adult children. The patient currently works day time. 7 Am to 7 Pm - non stop , 4 days a week. 55 work hours.  Pets are present. 2 dogs.  Tobacco use: none.  ETOH use; some - per week  2 drinks or less.  Caffeine intake in form of Coffee( 1 cup) Soda( none) Tea ( none), nor energy drinks. Regular exercise none .   Hobbies :none  Sleep habits are as follows: The patient's dinner time is between 7 PM. The patient goes to bed at 9 PM and continues to sleep for 2-3 hours en-bloc , wakes for unknown reasons- no trigger. The preferred sleep position is sideways, left, with the support of 1 pillow. Flat bed. Dreams are reportedly  rare.  5.30  AM is the usual rise time. The patient wakes up spontaneously. TST may be 7 hours or less.  He  reports not feeling refreshed or restored in AM, without  dry mouth , no morning headaches but  residual fatigue. Naps are taken in frequently, just too busy.  If he rests, he sleeps- a little less sleepy since retirement.    Review of Systems: Out of a complete 14 system review, the patient complains of only the following symptoms, and all other reviewed systems are negative.:  Fatigue, daytime sleepiness , snoring, fragmented sleep,  joint pain in the knees, and pain in the arms, antebrachial not proximal- he plays guitar - more so in retirement.     How likely are you to doze in the following situations: 0 = not likely, 1 = slight chance, 2 = moderate chance, 3 = high chance   Sitting and Reading? Watching Television? Sitting inactive in a public place (theater or meeting)? As a passenger in a car for an hour without a break? Lying down in the afternoon when circumstances permit? Sitting and talking to someone? Sitting quietly after lunch without alcohol? In a car, while stopped for a few minutes in traffic?   Total = 12 from 14 pre retirement/ 24 points   FSS endorsed at  13 from 20/ 63 points.   Social History   Socioeconomic History   Marital status: Married    Spouse name: Webb Silversmith   Number of children: 2 adult daughters,  both live in [place].    Years of education: Doctorate of Vet. Medicine    Highest education level: Not on file  Occupational History   Not on file  VETERINARIAN   Tobacco Use   Smoking status: Never   Smokeless tobacco: Never  Vaping Use   Vaping Use: Never used  Substance and Sexual Activity   Alcohol use: Yes    Alcohol/week: 7.0 standard drinks of alcohol    Types: 7 Standard drinks or equivalent per week    Comment: occassional    Drug use: No   Sexual activity: Yes  Other Topics Concern   Not on file; caretaker of elderly parents    Social History Narrative   Not on file   Social Determinants of Health   Financial Resource Strain: Not on file  Food Insecurity: Not on file  Transportation Needs: Not on file  Physical Activity: Not on file  Stress: Not on file  Social Connections: Not on file    Family History  Problem Relation Age of Onset   Heart disease Mother    Heart disease Father    Diabetes Mellitus II Father    Pancreatic cancer Maternal Aunt    Heart disease Maternal Grandfather    Heart disease Paternal Grandfather    Colon cancer Neg Hx    Colon polyps Neg Hx    Esophageal cancer Neg Hx  Stomach cancer Neg Hx    Rectal cancer Neg Hx     Past Medical History:  Diagnosis Date   Acromioclavicular joint arthritis 12/13/2020   Diverticulosis    Gout    Hyperlipidemia    Hypertension    stopped taking Lisinopril after a week   Impingement syndrome of right shoulder 12/13/2020   Inguinal hernia    right   Kidney stone    Morton's neuroma of left foot    [facility]ocardial infarction [facility]) 2020   with stent   Sleep apnea    Wears glasses     Past Surgical History:  Procedure Laterality Date   cardiac stent placement     COLONOSCOPY  2018   EXCISION MORTON'S NEUROMA Left 03/20/2017   Procedure: Left 3-4 Morton's Neuroma Excision;  Surgeon: Wylene Simmer, MD;  Location: [facility];  Service: Orthopedics;  Laterality: Left;   INGUINAL HERNIA REPAIR Bilateral 12/29/2014   Procedure: LAPAROSCOPIC BILATERAL INGUINAL HERNIA REPAIR;  Surgeon: Ralene Ok, MD;  Location: [facility];  Service: General;  Laterality: Bilateral;   INSERTION OF MESH Right 12/29/2014   Procedure: INSERTION OF MESH;  Surgeon: Ralene Ok, MD;  Location: [facility];  Service: General;  Laterality: Right;   LAPAROSCOPY N/A 12/29/2014   Procedure: LAPAROSCOPY DIAGNOSTIC;  Surgeon: Ralene Ok, MD;  Location: [facility];  Service: General;  Laterality: N/A;   LITHOTRIPSY     x3   LIVER BIOPSY N/A 12/29/2014    Procedure: LIVER BIOPSY;  Surgeon: Ralene Ok, MD;  Location: [facility];  Service: General;  Laterality: N/A;   SHOULDER ARTHROSCOPY WITH BICEPS TENDON REPAIR  01/02/2021   Procedure: BICEPS TENDON REPAIR;  Surgeon: Elsie Saas, MD;  Location: [facility];  Service: Orthopedics;;   SHOULDER ARTHROSCOPY WITH ROTATOR CUFF REPAIR AND SUBACROMIAL DECOMPRESSION Right 01/02/2021   Procedure: SHOULDER ARTHROSCOPY WITH ROTATOR CUFF REPAIR AND SUBACROMIAL DECOMPRESSION, DISTAL CLAVICLE EXCISION;  Surgeon: Elsie Saas, MD;  Location: [facility];  Service: Orthopedics;  Laterality: Right;   TONSILLECTOMY     VASECTOMY       Current Outpatient Medications on File Prior to Visit  Medication Sig Dispense Refill   allopurinol (ZYLOPRIM) 100 MG tablet Take 100 mg by mouth daily.     aspirin EC 81 MG tablet Take 81 mg by mouth daily.     hydrochlorothiazide (MICROZIDE) 12.5 MG capsule Take 12.5 mg by mouth daily.     losartan (COZAAR) 100 MG tablet Take 100 mg by mouth daily.     metoprolol tartrate (LOPRESSOR) 25 MG tablet Take 0.5 tablets (12.5 mg total) by mouth 2 (two) times daily. 45 tablet 3   potassium citrate (UROCIT-K) 10 MEQ (1080 MG) SR tablet Take 10 mEq by mouth daily at 6 (six) AM.     rosuvastatin (CRESTOR) 20 MG tablet Take 20 mg by mouth daily.     sildenafil (VIAGRA) 50 MG tablet Take 1 tablet (50 mg total) by mouth as needed for erectile dysfunction (1-2 tablets per day as needed). 20 tablet 3   VASCEPA 1 g capsule Take 2 capsules (2 g total) by mouth 2 (two) times daily. 360 capsule 3   No current facility-administered medications on file prior to visit.    Allergies  Allergen Reactions   Lisinopril Cough    Physical exam:  Today's Vitals   10/08/21 0833  BP: 118/64  Pulse: (!) 56  Weight: 194 lb (88 kg)  Height: 5' 10" (1.778 m)  Body mass index is 27.84 kg/m.   Wt Readings from Last 3 Encounters:  10/08/21 194 lb (88 kg)  08/06/21 194  lb (88 kg)  01/02/21 189 lb 13.1 oz (86.1 kg)     Ht Readings from Last 3 Encounters:  10/08/21 5' 10" (1.778 m)  08/06/21 5' 10" (1.778 m)  01/02/21 5' 10" (1.778 m)      General: The patient is awake, alert and appears not in acute distress. The patient is well groomed. Head: Normocephalic, atraumatic. Neck is supple. Mallampati 3- but lateral restriction./ pillars   neck circumference:16 inches .  Nasal airflow is patent.  Retrognathia is seen. Crowded teeth.  Dental status: intact Cardiovascular:  Regular rate and cardiac rhythm by pulse,  without distended neck veins. Respiratory: Lungs are clear .  Skin:  Without evidence of ankle edema, or rash. Trunk: The patient's posture is erect.   Neurologic exam : The patient is awake and alert, oriented to place and time.   Memory subjective described as intact.  Attention span & concentration ability appears normal.  Speech is fluent,  with dysphonia- chronically hoarse, non-seasonal. Mood and affect are appropriate.   Cranial nerves: no loss of smell or taste reported  Pupils are equal and briskly reactive to light. Funduscopic exam deferred.  Extraocular movements in vertical and horizontal planes were intact and without nystagmus. No Diplopia. Visual fields by finger perimetry are intact. Hearing was intact to tuning fork   Facial sensation intact to fine touch.  Facial motor strength is symmetric and tongue and uvula move midline.  Neck ROM : rotation, tilt and flexion extension were normal for age and shoulder shrug was symmetrical. Shoulders seem droopy.    Motor exam:  Symmetric bulk, tone and ROM.   Normal tone without cog -wheeling, symmetric grip strength .   Sensory:  Fine touch and vibration were normal.  Proprioception tested in the upper extremities was normal.   Coordination: Rapid alternating movements in the fingers/hands were of normal speed.  The Finger-to-nose maneuver was deferred.   Gait and station:  deferred..  Toe and heel walk were deferred.  Deep tendon reflexes: in the  upper and lower extremities are symmetric and intact.  Babinski response was deferred.        After spending a total time of  35  minutes face to face and additional time for physical and neurologic examination, review of laboratory studies,  personal review of imaging studies, reports and results of other testing and review of referral information / records as far as provided in visit, I have established the following assessments:   CAD- atypical chest pain, radiating into the shoulder, cramping in bot legs.  Non restorative sleep. Daytime sleepiness  Frequent sinusitis, rhinitis,  Some joint pain.  Antebrachial pain.   1)  Higher degree of sleepiness in daytime , attributed to fragmented sleep. No pain, no bad dreams, but snoring.  2) sleep initiation is not a problem, sustaining sleep is a problem.  3) alcohol on weekends may make snoring louder. 4) OSA was mild, I need to repeat HST with a 3 % score.    My Plan is to proceed with:  OSA was mild, I need to repeat HST with a 3 % score.  Supine sleep to be avoided.  Tinnitus treatment: Start with allegra for chronic hoarseness, he is already on diuretics, takes e'lytes extra.  B complex.     I would like to thank Avva, Ravisankar, MD and for  allowing me to meet with and to take care of this pleasant patient.   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   CC: I will share my notes with PCP/ cardiology  Electronically signed by: Larey Seat, MD 10/08/2021 8:38 AM  [facility] Board certified by The [employer] of Sleep Medicine and Diplomate of the [employer] of Sleep Medicine. Board certified In Neurology through the [place], Fellow of the [employer] of Neurology. Medical Director of [facility].

## 2021-10-18 ENCOUNTER — Telehealth: Payer: Self-pay | Admitting: Neurology

## 2021-10-18 NOTE — Telephone Encounter (Signed)
HST- [employer] [place] no auth req.  Patient is scheduled at [facility] for 11/20/21 at 8 AM.  Mailed packet to the patient.

## 2021-11-20 ENCOUNTER — Ambulatory Visit: Payer: BC Managed Care – PPO | Admitting: Neurology

## 2021-11-20 DIAGNOSIS — Z95820 Peripheral vascular angioplasty status with implants and grafts: Secondary | ICD-10-CM

## 2021-11-20 DIAGNOSIS — R0683 Snoring: Secondary | ICD-10-CM

## 2021-11-20 DIAGNOSIS — G4710 Hypersomnia, unspecified: Secondary | ICD-10-CM | POA: Diagnosis not present

## 2021-11-20 DIAGNOSIS — G478 Other sleep disorders: Secondary | ICD-10-CM

## 2021-11-20 DIAGNOSIS — I25111 Atherosclerotic heart disease of native coronary artery with angina pectoris with documented spasm: Secondary | ICD-10-CM

## 2021-11-20 DIAGNOSIS — D751 Secondary polycythemia: Secondary | ICD-10-CM

## 2021-11-20 DIAGNOSIS — G4719 Other hypersomnia: Secondary | ICD-10-CM

## 2021-11-22 NOTE — Progress Notes (Signed)
[facility] TEST REPORT ( by [facility] PAT)   Dr. Georgiann Hahn. 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 Dagmar Hait  for a sleep consultation.  We had met in 2020 during the pandemic and the patient was lost in follow up- see 11-18-2018 notes, now with erythrocytosis, HST in 2020 documented AHI of 8/h, REM AHI was 19.5/h. Chief concern according to patient: "concerns of inconsistent sleep patterns that began years ago- These are leading to more fatigue during the day. Loud snoring, Difficulties maintaining sleep and quality of sleep has declined."  The patient reports hypersomnia, and will easily fall asleep when not stimulated or physically active. History of MI/ CAD/ Gout/HTN.    Epworth sleepiness score: 12/24.  FSS at 13/ 63   BMI: 27.8kg/m   Neck Circumference: 16"   FINDINGS:   Sleep Summary:   Total Recording Time (hours, min): 7 h and 72 m       Total Sleep Time (hours, min):    7 h and 35 m             Percent REM (%):    29%                                    Respiratory Indices:   Calculated pAHI (per hour):    30/h                         REM pAHI:  43/h                                               NREM pAHI:    25/h                          Positional AHI:  supine 34/h, prone AHI 22.6/h, Left sided AHI 49/h.  Right-sided sleep AHI was 23.3/h.      Snoring data show a mean volume of 41 dB present for 17% of trend of total sleep time.                                       Oxygen Saturation Statistics:     O2 Saturation Range (%):      Between a nadir of 79 and a maximum of 98% saturation with a mean saturation at 93%.  There were 95 desaturation events.                                 O2 Saturation  (minutes) <89%:       0.6 minutes  Pulse Rate Statistics:   Pulse Mean (bpm):     54 bpm            Pulse Range: Between bradycardia at 43 bpm and the maximum of 87 bpm  IMPRESSION:  This HST confirms the presence of moderate severe obstructive sleep apnea stronger accentuated during REM sleep and sleep but not not positional dependent.  The patient slept uninterrupted according to this home sleep test between midnight and 5:30 AM sleep has not been fragmented.  However REM sleep accentuated sleep with sleep without hypoxia could be treated by auto titration CPAP the patient could be a candidate for a inspire device (also I feel that that device is rarely ever resolving the AHI )there were no central apneas indicated on home sleep test.   RECOMMENDATION: I recommend a CPAP therapy by auto titration device from a [employer] such as [employer] or [employer]	[place] - with a setting between 5 and 16 cmH2O , 2 centimeter EPR, heated humidification, and an interface of patient's choice and comfort to be fitted in reclined position.    INTERPRETING PHYSICIAN:   Larey Seat, MD   Medical Director of [facility] at [facility].

## 2021-11-25 NOTE — Procedures (Signed)
     [facility]   HOME SLEEP TEST REPORT ( by Watch PAT)   Vincent Mendoza 66 year old male STUDY DATE:  11-22-2021 DOB:  01/27/1956   ORDERING CLINICIAN: Anet Logsdon, MD  REFERRING CLINICIAN: Dr Avva, MD PhD  CLINICAL INFORMATION/HISTORY: 10-08-2021:    Vincent Mendoza, a meanwhile retired veterinarian, is seen upon re- referral from Dr. Avva  for a sleep consultation.  We had met in 2020 during the pandemic and the patient was lost in follow up- see 11-18-2018 notes, now with erythrocytosis, HST in 2020 documented AHI of 8/h, REM AHI was 19.5/h. Chief concern according to patient: "concerns of inconsistent sleep patterns that began years ago- These are leading to more fatigue during the day. Loud snoring, Difficulties maintaining sleep and quality of sleep has declined."  The patient reports hypersomnia, and will easily fall asleep when not stimulated or physically active. History of MI/ CAD/ Gout/HTN.    Epworth sleepiness score: 12/24.  FSS at 13/ 63   BMI: 27.8kg/m   Neck Circumference: 16"   FINDINGS:   Sleep Summary:   Total Recording Time (hours, min): 7 h and 59 m       Total Sleep Time (hours, min):    7 h and 35 m             Percent REM (%):    29%                                    Respiratory Indices:   Calculated pAHI (per hour):    30/h                         REM pAHI:  43/h                                               NREM pAHI:    25/h                          Positional AHI:  supine 34/h, prone AHI 22.6/h, Left sided AHI 49/h.  Right-sided sleep AHI was 23.3/h.      Snoring data show a mean volume of 41 dB present for 17% of trend of total sleep time.                                       Oxygen Saturation Statistics:     O2 Saturation Range (%):      Between a nadir of 79 and a maximum of 98% saturation with a mean saturation at 93%.  There were 95 desaturation events.                                 O2 Saturation  (minutes) <89%:       0.6 minutes  Pulse Rate Statistics:   Pulse Mean (bpm):     54 bpm            Pulse Range: Between bradycardia at 43 bpm and the maximum of 87 bpm                 IMPRESSION:  This HST confirms the presence of moderate severe obstructive sleep apnea stronger accentuated during REM sleep and sleep but not not positional dependent.  The patient slept uninterrupted according to this home sleep test between midnight and 5:30 AM sleep has not been fragmented.  However REM sleep accentuated sleep with sleep without hypoxia could be treated by auto titration CPAP the patient could be a candidate for a inspire device (also I feel that that device is rarely ever resolving the AHI )there were no central apneas indicated on home sleep test.   RECOMMENDATION: I recommend a CPAP therapy by auto titration device from a [employer] such as [employer] or [employer]	[place] - with a setting between 5 and 16 cmH2O , 2 centimeter EPR, heated humidification, and an interface of patient's choice and comfort to be fitted in reclined position.    INTERPRETING PHYSICIAN:   Larey Seat, MD   Medical Director of [facility].

## 2021-11-25 NOTE — Addendum Note (Signed)
Addended by: Larey Seat on: 11/25/2021 12:51 PM   Modules accepted: Orders

## 2021-11-27 ENCOUNTER — Encounter: Payer: Self-pay | Admitting: Neurology

## 2021-11-27 NOTE — Telephone Encounter (Signed)
I called pt. I advised pt that Dr. Brett Fairy reviewed their sleep study results and found that pt has severe osa. Dr. Brett Fairy recommends that pt starts auto cpap. I reviewed PAP compliance expectations with the pt. Pt is agreeable to starting a CPAP. I advised pt that an order will be sent to a DME, [facility], and [facility] will call the pt within about one week after they file with the pt's insurance. [facility] will show the pt how to use the machine, fit for masks, and troubleshoot the CPAP if needed. A follow up appt was made for insurance purposes with Hedwig Morton, NP on 02/12/2022 at 9:30 am. Pt verbalized understanding to arrive 15 minutes early and bring their CPAP. A letter with all of this information in it will be mailed to the pt as a reminder. I verified with the pt that the address we have on file is correct. Pt verbalized understanding of results. Pt had no questions at this time but was encouraged to call back if questions arise. I have sent the order to [facility] and have received confirmation that they have received the order.  From Dr Dohmeier IMPRESSION:  This HST confirms the presence of moderate severe obstructive sleep apnea stronger accentuated during REM sleep and sleep but not not positional dependent.  The patient slept uninterrupted according to this home sleep test between midnight and 5:30 AM sleep has not been fragmented.     However REM sleep accentuated sleep with sleep without hypoxia could be treated by auto titration CPAP the patient could be a candidate for a inspire device (also I feel that that device is rarely ever resolving the AHI )there were no central apneas indicated on home sleep test.     RECOMMENDATION: I recommend a CPAP therapy by auto titration device from a [employer] such as [employer] or [employer]	[place] - with a setting between 5 and 16 cmH2O , 2 centimeter EPR, heated humidification, and an interface of patient's choice and comfort to be fitted in reclined position.

## 2021-12-05 ENCOUNTER — Ambulatory Visit: Payer: BC Managed Care – PPO | Attending: Internal Medicine | Admitting: Internal Medicine

## 2021-12-05 VITALS — BP 126/82 | HR 58 | Ht 70.0 in | Wt 194.0 lb

## 2021-12-05 DIAGNOSIS — E782 Mixed hyperlipidemia: Secondary | ICD-10-CM | POA: Diagnosis not present

## 2021-12-05 DIAGNOSIS — Z9861 Coronary angioplasty status: Secondary | ICD-10-CM | POA: Diagnosis not present

## 2021-12-05 DIAGNOSIS — I2510 Atherosclerotic heart disease of native coronary artery without angina pectoris: Secondary | ICD-10-CM | POA: Diagnosis not present

## 2021-12-05 DIAGNOSIS — N528 Other male erectile dysfunction: Secondary | ICD-10-CM | POA: Diagnosis not present

## 2021-12-05 NOTE — Patient Instructions (Signed)
Medication Instructions:  Your physician recommends that you continue on your current medications as directed. Please refer to the Current Medication list given to you today.   Labwork: Fasting Lipid Panel- Nothing to eat/drink at least 6 hours prior to having lab drawn  Testing/Procedures: None  Follow-Up: Follow up with Dr. Debara Pickett in 1 year.   Any Other Special Instructions Will Be Listed Below (If Applicable).     If you need a refill on your cardiac medications before your next appointment, please call your pharmacy.

## 2021-12-05 NOTE — Progress Notes (Addendum)
OFFICE CONSULT NOTE  Chief Complaint:  Follow-up  Primary Care Physician: Prince Solian, MD  HPI:  Vincent Mendoza is a 66 y.o. male who is being seen today for the evaluation of recent NSTEMI at the request of Avva, Ravisankar, MD. 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  According to his records, he presented at [facility] after awakening from his dead sleep at 3 AM.  He had left-sided shoulder pain radiating anteriorly of the mid chest and neck.  He said it lasted for more than 10 minutes was noted to be about a 4 out of 10 chest pain.  He presented to the emergency department and initial troponin was elevated 0.1.  Subsequent troponins were 0.35 and 0.63.  He was diagnosed with a non-STEMI and treated appropriately.  He did undergo left heart catheterization on 04/20/2018 at [facility].  This demonstrated an 80% proximal LAD stenosis and a 40% distal LAD stenosis.  There was a 50% mid RCA lesion as well.  Subsequently he underwent balloon angioplasty and placement of a 2.5 x 18 mm Onyx resolute drug-eluting stent.  This was postdilated with a 3.0 x 15 mm Quantum apex balloon.  TIMI-3 flow was noted.  He had an echocardiogram as well which showed normal systolic function, mild LVH, very mild hypokinesis of the basal anterior mid anterior and apical anterior walls.  Grade 1 diastolic dysfunction was noted with mild aortic insufficiency.  Since stenting he is done well.  He is got some occasional sharp chest discomfort but no further anginal symptoms.  He has started walking more regularly and says he is committed to changing his dietary habits.  Although he has a history of dyslipidemia he had been on high potency statin therapy with Crestor 20 mg daily.  During a recent physical with his PCP, his labs indicated total cholesterol 158, HDL 50, LDL 79 and triglycerides 147.  He had repeat labs during his hospitalization which showed total  cholesterol of 130, triglycerides 197, HDL 42 and LDL of 49.  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  Overall he says he is doing well.  He is doing home rehabilitation.  Occasionally gets some pain in his left shoulder area which is reminiscent of his anginal symptoms when he starts exercising however does dissipate with more exercise.  He denies any worsening shortness of breath in fact his exercise is getting a little easier.  Recently though he has not been exercising as much due to stress at work.  He hopes to get back to that.  He was successfully started on Vascepa.  We have not yet had repeat lipids.  He plans to see his primary care provider in December will have lab work at that time which I will review.  Most likely we could consider coming off of dual antiplatelet therapy next February.  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 Overall he is doing very well. He denies chest pain or shortness of breath. He is 1 year post-PCI. At this point, I feel we can discontinue his Plavix. His only real complaint is ED - he wonders if the metoprolol may be contributing to that. It is possible, but in my experience of using it frequently over many years, it is less likely.  He did bring this up to his urologist who had prescribed him Viagra but he has not used it consistently.  I feel great importance of  using beta-blockers in patients with non-STEMI and coronary artery disease given multiple cardiovascular benefits.  He was advised not to use nitrates and Viagra concomitantly.  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  He has no significant complaints.  He gets occasional chest discomfort but is not necessarily with exertion or relieved by rest.  He has not used nitroglycerin and his prescriptions now 64 years old.  His blood pressure is excellent today at 122/74.  Labs in January showed a marked improvement in his cholesterol with total 129, HDL 44, LDL 61 and triglycerides  119.  EKG shows a sinus bradycardia at 58 without ischemia.  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  No symptoms of angina or worsening shortness of breath.  Had ongoing lab work through his primary care provider, last in February 2023 which showed total cholesterol 129, triglycerides 112, HDL 36 and LDL 71.  His main concerns today however related to fatigue.  This has been a longstanding issue, in fact he underwent a sleep study for further evaluation of this back in 2020.  This was interpreted at [facility] neurologic as mild sleep apnea.  He was supposed to have an in lab titration but never followed up with that and is not wearing his CPAP.  In addition he struggled with erectile dysfunction.  He has had some success with sildenafil however reports it does not always work and he has issues with headaches.  He has not had assessment of his testosterone to my knowledge.  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  He also has tick exposure on his property.  He had requested evaluation for unusual tickborne illnesses.  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  Overall he is done pretty well.  Blood pressure is well controlled.  He denies any chest pain or shortness of breath.  He is due for lipid profile which we will order.  EKG today shows a sinus bradycardia.  He did not have much success with sildenafil regarding erectile dysfunction.  I encouraged him to reach out to his urologist for other options.  We had trialed a short period of time off of statin to see if he had some improvement in his symptoms and he noted he did but he has subsequently restarted it at a lower dose.  PMHx:  Past Medical History:  Diagnosis Date   Acromioclavicular joint arthritis 12/13/2020   Diverticulosis    Gout    Hyperlipidemia    Hypertension    stopped taking Lisinopril after a week   Impingement syndrome of right  shoulder 12/13/2020   Inguinal hernia    right   Kidney stone    Morton's neuroma of left foot    Myocardial infarction [facility]) 2020   with stent   Sleep apnea    Wears glasses     Past Surgical History:  Procedure Laterality Date   cardiac stent placement     COLONOSCOPY  2018   EXCISION MORTON'S NEUROMA Left 03/20/2017   Procedure: Left 3-4 Morton's Neuroma Excision;  Surgeon: Wylene Simmer, MD;  Location: [facility];  Service: Orthopedics;  Laterality: Left;   INGUINAL HERNIA REPAIR Bilateral 12/29/2014   Procedure: LAPAROSCOPIC BILATERAL INGUINAL HERNIA REPAIR;  Surgeon: Ralene Ok, MD;  Location: [place];  Service: General;  Laterality: Bilateral;   INSERTION OF MESH Right 12/29/2014   Procedure: INSERTION OF MESH;  Surgeon: Ralene Ok,  MD;  Location: [facility];  Service: General;  Laterality: Right;   LAPAROSCOPY N/A 12/29/2014   Procedure: LAPAROSCOPY DIAGNOSTIC;  Surgeon: Ralene Ok, MD;  Location: [place];  Service: General;  Laterality: N/A;   LITHOTRIPSY     x3   LIVER BIOPSY N/A 12/29/2014   Procedure: LIVER BIOPSY;  Surgeon: Ralene Ok, MD;  Location: [facility];  Service: General;  Laterality: N/A;   SHOULDER ARTHROSCOPY WITH BICEPS TENDON REPAIR  01/02/2021   Procedure: BICEPS TENDON REPAIR;  Surgeon: Elsie Saas, MD;  Location: [facility];  Service: Orthopedics;;   SHOULDER ARTHROSCOPY WITH ROTATOR CUFF REPAIR AND SUBACROMIAL DECOMPRESSION Right 01/02/2021   Procedure: SHOULDER ARTHROSCOPY WITH ROTATOR CUFF REPAIR AND SUBACROMIAL DECOMPRESSION, DISTAL CLAVICLE EXCISION;  Surgeon: Elsie Saas, MD;  Location: [place];  Service: Orthopedics;  Laterality: Right;   TONSILLECTOMY     VASECTOMY      FAMHx:  Family History  Problem Relation Age of Onset   Heart disease Mother    Heart disease Father    Diabetes Mellitus II Father    Pancreatic cancer Maternal Aunt    Heart disease Maternal Grandfather    Heart  disease Paternal Grandfather    Colon cancer Neg Hx    Colon polyps Neg Hx    Esophageal cancer Neg Hx    Stomach cancer Neg Hx    Rectal cancer Neg Hx     SOCHx:   reports that he has never smoked. He has never used smokeless tobacco. He reports current alcohol use of about 7.0 standard drinks of alcohol per week. He reports that he does not use drugs.  ALLERGIES:  Allergies  Allergen Reactions   Lisinopril Cough    ROS: Pertinent items noted in HPI and remainder of comprehensive ROS otherwise negative.  HOME MEDS: Current Outpatient Medications on File Prior to Visit  Medication Sig Dispense Refill   allopurinol (ZYLOPRIM) 100 MG tablet Take 100 mg by mouth daily.     aspirin EC 81 MG tablet Take 81 mg by mouth daily.     fexofenadine (ALLEGRA) 60 MG tablet Take 1 tablet (60 mg total) by mouth 2 (two) times daily. 180 tablet 3   hydrochlorothiazide (MICROZIDE) 12.5 MG capsule Take 12.5 mg by mouth daily.     losartan (COZAAR) 100 MG tablet Take 100 mg by mouth daily.     metoprolol tartrate (LOPRESSOR) 25 MG tablet Take 0.5 tablets (12.5 mg total) by mouth 2 (two) times daily. 45 tablet 3   potassium citrate (UROCIT-K) 10 MEQ (1080 MG) SR tablet Take 10 mEq by mouth daily at 6 (six) AM.     rosuvastatin (CRESTOR) 20 MG tablet Take 10 mg by mouth daily.     sildenafil (VIAGRA) 50 MG tablet Take 1 tablet (50 mg total) by mouth as needed for erectile dysfunction (1-2 tablets per day as needed). 20 tablet 3   VASCEPA 1 g capsule Take 2 capsules (2 g total) by mouth 2 (two) times daily. 360 capsule 3   No current facility-administered medications on file prior to visit.    LABS/IMAGING: No results found for this or any previous visit (from the past 48 hour(s)). No results found.  LIPID PANEL: No results found for: "CHOL", "TRIG", "HDL", "CHOLHDL", "VLDL", "LDLCALC", "LDLDIRECT"  WEIGHTS: Wt Readings from Last 3 Encounters:  12/05/21 194 lb (88 kg)  10/08/21 194 lb (88 kg)   08/06/21 194 lb (88 kg)    VITALS: BP 126/82  Pulse (!) 58   Ht '5\' 10"'$  (1.778 m)   Wt 194 lb (88 kg)   SpO2 96%   BMI 27.84 kg/m   EXAM: General appearance: alert and no distress Neck: no carotid bruit, no JVD and thyroid not enlarged, symmetric, no tenderness/mass/nodules Lungs: clear to auscultation bilaterally Heart: regular rate and rhythm, S1, S2 normal and diastolic murmur: early diastolic 2/6, blowing at 2nd right intercostal space Abdomen: soft, non-tender; bowel sounds normal; no masses,  no organomegaly Extremities: extremities normal, atraumatic, no cyanosis or edema Pulses: 2+ and symmetric Skin: Skin color, texture, turgor normal. No rashes or lesions Neurologic: Grossly normal Psych: Pleasant  EKG: Sinus rhythm at 58, inferior infarct pattern-personally reviewed  ASSESSMENT: NSTEMI CAD status post DES to the proximal LAD with residual 50% mid RCA disease and distal LAD disease (04/20/2018) Dyslipidemia Hypertension Family history of coronary disease in both parents Fatigue Mild OSA-not on CPAP Erectile dysfunction   PLAN: 1.   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  He is due for repeat lipid.  His blood pressure is well controlled.  He has had difficulty with erectile dysfunction not improved with sildenafil.  He is to reach out to his urologist for more options with this.  Plan follow-up with me annually or sooner as necessary.  Pixie Casino, MD, [facility], [place] Director of the [facility] Lip[facility] &  Cardiovascular Risk [facility] Diplomate of the American Board of Clinical Lipidology Attending Cardiologist  Direct Dial: [phone]  Fax: [phone]  Website:  [email or web address] 12/05/2021, 2:00 PM

## 2021-12-11 NOTE — Addendum Note (Signed)
Addended by: Christella Scheuermann C on: 12/11/2021 11:18 AM   Modules accepted: Orders

## 2021-12-13 ENCOUNTER — Other Ambulatory Visit (HOSPITAL_COMMUNITY)
Admission: RE | Admit: 2021-12-13 | Discharge: 2021-12-13 | Disposition: A | Discharge status: Home / Self Care | Payer: BC Managed Care – PPO | Source: Ambulatory Visit | Attending: Internal Medicine | Admitting: Internal Medicine

## 2021-12-13 DIAGNOSIS — E782 Mixed hyperlipidemia: Secondary | ICD-10-CM | POA: Diagnosis present

## 2021-12-13 LAB — LIPID PANEL
Cholesterol: 121 mg/dL (ref 0–200)
HDL: 37 mg/dL — ABNORMAL LOW (ref >40)
LDL Cholesterol: 65 mg/dL (ref 0–99)
Total CHOL/HDL Ratio: 3.3 RATIO
Triglycerides: 96 mg/dL (ref <150)
VLDL: 19 mg/dL (ref 0–40)

## 2022-02-11 ENCOUNTER — Telehealth: Payer: Self-pay | Admitting: Adult Health

## 2022-02-11 NOTE — Telephone Encounter (Signed)
Sent mychart msg informing pt of need to reschedule 12/19 appointment - NP out

## 2022-02-12 ENCOUNTER — Ambulatory Visit: Payer: BC Managed Care – PPO | Admitting: Adult Health

## 2022-04-16 ENCOUNTER — Other Ambulatory Visit: Payer: Self-pay

## 2022-04-16 MED ORDER — VASCEPA 1 G PO CAPS: 2.0000 g | ORAL_CAPSULE | Freq: Two times a day (BID) | ORAL | 2 refills | Status: DC

## 2022-05-08 LAB — CMP 10231: EGFR: 97.3

## 2022-09-17 ENCOUNTER — Other Ambulatory Visit (HOSPITAL_COMMUNITY): Payer: Self-pay

## 2022-09-18 ENCOUNTER — Other Ambulatory Visit (HOSPITAL_COMMUNITY): Payer: Self-pay

## 2022-09-18 MED ORDER — SEMAGLUTIDE-WEIGHT MANAGEMENT 0.25 MG/0.5ML ~~LOC~~ SOAJ
0.2500 mg | SUBCUTANEOUS | 1 refills | Status: AC
Start: 2022-09-16 — End: ?
  Filled 2022-09-18: qty 2, 28d supply, fill #0

## 2022-09-18 MED ORDER — OZEMPIC (0.25 OR 0.5 MG/DOSE) 2 MG/3ML ~~LOC~~ SOPN
PEN_INJECTOR | SUBCUTANEOUS | 0 refills | Status: AC
  Filled 2022-09-18: qty 3, 28d supply, fill #0

## 2022-09-18 MED ORDER — SEMAGLUTIDE-WEIGHT MANAGEMENT 0.5 MG/0.5ML ~~LOC~~ SOAJ
0.5000 mg | SUBCUTANEOUS | 1 refills | Status: AC
  Filled 2022-09-18: qty 2, 28d supply, fill #0

## 2022-09-19 ENCOUNTER — Other Ambulatory Visit (HOSPITAL_COMMUNITY): Payer: Self-pay

## 2022-09-27 ENCOUNTER — Other Ambulatory Visit (HOSPITAL_COMMUNITY): Payer: Self-pay

## 2022-11-22 ENCOUNTER — Other Ambulatory Visit (HOSPITAL_COMMUNITY): Payer: Self-pay

## 2022-11-22 MED ORDER — WEGOVY 0.5 MG/0.5ML ~~LOC~~ SOAJ
0.5000 mg | SUBCUTANEOUS | 1 refills | Status: AC
Start: 2022-11-22 — End: ?
  Filled 2022-12-11: qty 2, 28d supply, fill #0

## 2022-11-22 MED ORDER — WEGOVY 0.25 MG/0.5ML ~~LOC~~ SOAJ
0.2500 mg | SUBCUTANEOUS | 0 refills | Status: AC
  Filled 2022-11-22: qty 2, 28d supply, fill #0

## 2022-11-25 ENCOUNTER — Telehealth: Payer: Self-pay | Admitting: Internal Medicine

## 2022-11-25 ENCOUNTER — Other Ambulatory Visit (HOSPITAL_COMMUNITY): Payer: Self-pay

## 2022-11-25 MED ORDER — VASCEPA 1 G PO CAPS: 2.0000 g | ORAL_CAPSULE | Freq: Two times a day (BID) | ORAL | 0 refills | Status: AC

## 2022-11-25 NOTE — Telephone Encounter (Signed)
*  STAT* If patient is at the pharmacy, call can be transferred to refill team.   1. Which medications need to be refilled? (please list name of each medication and dose if known) Vascepa 1g (2 tablets/daily)   2. Would you like to learn more about the convenience, safety, & potential cost savings by using the [facility]? NA   3. Are you open to using the [facility] NA   4. Which pharmacy/location (including street and city if local pharmacy) is medication to be sent to? [facility]    5. Do they need a 30 day or 90 day supply? 90 day supply(completely out)  Patient has not had to pay for medication and has gotten in the mail in the past.  Please give patient a call to discuss which pharmacy this can be sent to that will not charge him.

## 2022-12-03 ENCOUNTER — Ambulatory Visit (HOSPITAL_COMMUNITY): Payer: Medicare Other

## 2022-12-06 ENCOUNTER — Other Ambulatory Visit: Payer: Self-pay

## 2022-12-06 ENCOUNTER — Ambulatory Visit (HOSPITAL_COMMUNITY): Payer: Medicare Other | Attending: Anesthesiology

## 2022-12-06 ENCOUNTER — Encounter (HOSPITAL_COMMUNITY): Payer: Self-pay

## 2022-12-06 DIAGNOSIS — M6281 Muscle weakness (generalized): Secondary | ICD-10-CM | POA: Diagnosis present

## 2022-12-06 DIAGNOSIS — M545 Low back pain, unspecified: Secondary | ICD-10-CM | POA: Diagnosis present

## 2022-12-06 NOTE — Therapy (Cosign Needed)
OUTPATIENT PHYSICAL THERAPY THORACOLUMBAR EVALUATION   Patient Name: Vincent Mendoza MRN: [id number] DOB:November 04, 1955, 67 y.o., male Today's Date: 12/06/2022  END OF SESSION:  PT End of Session - 12/06/22 1228     Visit Number 1    Date for PT Re-Evaluation 01/31/23    Authorization Type Medicare part A & B; BCBS supplement secondary    Authorization Time Period no auth; no limit    Authorization - Visit Number 1    Progress Note Due on Visit 8    PT Start Time 1020    PT Stop Time 1105    PT Time Calculation (min) 45 min    Behavior During Therapy [facility] for tasks assessed/performed             Past Medical History:  Diagnosis Date   Acromioclavicular joint arthritis 12/13/2020   Diverticulosis    Gout    Hyperlipidemia    Hypertension    stopped taking Lisinopril after a week   Impingement syndrome of right shoulder 12/13/2020   Inguinal hernia    right   Kidney stone    Morton's neuroma of left foot    Myocardial infarction [facility]) 2020   with stent   Sleep apnea    Wears glasses    Past Surgical History:  Procedure Laterality Date   cardiac stent placement     COLONOSCOPY  2018   EXCISION MORTON'S NEUROMA Left 03/20/2017   Procedure: Left 3-4 Morton's Neuroma Excision;  Surgeon: Toni Arthurs, MD;  Location: [facility];  Service: Orthopedics;  Laterality: Left;   INGUINAL HERNIA REPAIR Bilateral 12/29/2014   Procedure: LAPAROSCOPIC BILATERAL INGUINAL HERNIA REPAIR;  Surgeon: Axel Filler, MD;  [facility]: [facility];  Service: General;  Laterality: Bilateral;   INSERTION OF MESH Right 12/29/2014   Procedure: INSERTION OF MESH;  Surgeon: Axel Filler, MD;  [facility]: [facility];  Service: General;  Laterality: Right;   LAPAROSCOPY N/A 12/29/2014   Procedure: LAPAROSCOPY DIAGNOSTIC;  Surgeon: Axel Filler, MD;  Location: [facility];  Service: General;  Laterality: N/A;   LITHOTRIPSY     x3   LIVER BIOPSY N/A 12/29/2014   Procedure: LIVER BIOPSY;  Surgeon:  Axel Filler, MD;  [facility]: [facility];  Service: General;  Laterality: N/A;   SHOULDER ARTHROSCOPY WITH BICEPS TENDON REPAIR  01/02/2021   Procedure: BICEPS TENDON REPAIR;  Surgeon: Salvatore Marvel, MD;  Location: [facility];  Service: Orthopedics;;   SHOULDER ARTHROSCOPY WITH ROTATOR CUFF REPAIR AND SUBACROMIAL DECOMPRESSION Right 01/02/2021   Procedure: SHOULDER ARTHROSCOPY WITH ROTATOR CUFF REPAIR AND SUBACROMIAL DECOMPRESSION, DISTAL CLAVICLE EXCISION;  Surgeon: Salvatore Marvel, MD;  Location: [facility];  Service: Orthopedics;  Laterality: Right;   TONSILLECTOMY     VASECTOMY     Patient Active Problem List   Diagnosis Date Noted   Other erythrocytosis 10/08/2021   Traumatic rotator cuff tear, right, subsequent encounter 12/13/2020   Impingement syndrome of right shoulder 12/13/2020   Acromioclavicular joint arthritis 12/13/2020   Erythrocytosis 11/18/2018   Non-restorative sleep 11/18/2018   Coronary artery disease involving native coronary artery of native heart with angina pectoris with documented spasm (HCC) 11/18/2018   Status post angioplasty with stent 11/18/2018   Excessive daytime sleepiness 11/18/2018   Snoring 11/18/2018   Hip pain, acute, unspecified laterality 11/18/2018   Diverticulitis of colon 05/18/2014    PCP: Chilton Greathouse MD  REFERRING PROVIDER: Windle Guard, MD  REFERRING DIAG: M54.50 (ICD-10-CM) - Low back pain  Rationale for Evaluation and Treatment: Rehabilitation  THERAPY DIAG:  Lumbar back pain  Muscle weakness (generalized)  ONSET DATE: Multiple years  SUBJECTIVE:                                                                                                                                                                                           SUBJECTIVE STATEMENT: Chronic problem over past few years. Notices standing for long times will increase pain. 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  Back gets tired in standing the most. Pain in coccyx area with prolonged sitting irritates it and feels like he has had fall on bottom but no fall. Getting ready to start on Personal trainer on Monday. 3.5 mile walk this morning. Prolonged rest in one position also increased pain. Side sleeper with at least 6-8 hours of sleep. Reports increased urgency in urination in recent time.  PERTINENT HISTORY:  Morton's Neuroma Stent Diuretics  PAIN:  Are you having pain? Yes: NPRS scale: 2/10 Pain location: low back Pain description: achy Aggravating factors: long term work Relieving factors: resting   PRECAUTIONS: None  RED FLAGS: None   WEIGHT BEARING RESTRICTIONS: No  FALLS:  Has patient fallen in last 6 months? No   OCCUPATION: Retired vertinarian   PLOF: Independent  PATIENT GOALS: control pain  NEXT MD VISIT: no followup yet  OBJECTIVE:  Note: Objective measures were completed at Evaluation unless otherwise noted.  DIAGNOSTIC FINDINGS:  X-ray lumbar spine (10/18/2022): 5 lumbar-type vertebral bodies without significant scoliosis. No high-grade listhesis. Disc space heights well-maintained. Facet arthrosis at L4-5 and L5-S1. Mildly exaggerated lumbar lordosis. No significant displacement of the coccyx.   PATIENT SURVEYS:  Modified Oswestry 9/50   COGNITION: Overall cognitive status: Within functional limits for tasks assessed     SENSATION: WFL  MUSCLE LENGTH: Hamstrings: Right 65 deg; Left 55 deg and painful Thomas test: next sesion  POSTURE: rounded shoulders, forward head, increased lumbar lordosis, and anterior pelvic tilt  PALPATION: TTP along lumbar paraspinals and increased tension note through paraspinals  LUMBAR ROM:   AROM eval  Flexion 50%  Extension 75% and painful  Right lateral flexion 75% and painful  Left lateral flexion 75% and painful  Right rotation 75% and painful  Left rotation 75% and painful   (Blank rows = not tested)  LOWER  EXTREMITY ROM:     Passive  Right eval Left eval  Hip flexion    Hip extension    Hip abduction    Hip adduction    Hip internal rotation 18 15  Hip external rotation [facility]  Knee flexion  Knee extension    Ankle dorsiflexion    Ankle plantarflexion    Ankle inversion    Ankle eversion     (Blank rows = not tested)  LOWER EXTREMITY MMT:    MMT Right eval Left eval  Hip flexion 4- 4-  Hip extension 3+ 3+  Hip abduction 4- 4-  Hip adduction    Hip internal rotation    Hip external rotation    Knee flexion    Knee extension    Ankle dorsiflexion    Ankle plantarflexion    Ankle inversion    Ankle eversion     (Blank rows = not tested)  LUMBAR SPECIAL TESTS:  Seated extension rotation test Marden Noble test): Positive  GAIT: Distance walked: 55ft Assistive device utilized: None Level of assistance: Complete Independence Comments: mild bilateral trendelenberg with anterior pelvic tilt  TODAY'S TREATMENT:                                                                                                                              DATE:  12/06/2022 PT evaluation, findings, frequency, prognosis    PATIENT EDUCATION:  Education details: Findings, prognosis, discussion of relation between back pain and pelvic floor pain. Discussed coccyx pain as well. P Person educated: Patient Education method: Explanation, Demonstration, Tactile cues, Verbal cues, and Handouts Education comprehension: verbalized understanding and returned demonstration  HOME EXERCISE PROGRAM: Access Code: [email or web address] URL: [email or web address] Date: 12/06/2022 Prepared by: Starling Manns  Exercises - Supine Posterior Pelvic Tilt  - 1 x daily - 7 x weekly - 3 sets - 10 reps - Seated Hamstring Stretch  - 1 x daily - 7 x weekly - 3 sets - 10 reps  ASSESSMENT:  CLINICAL IMPRESSION: Patient is a 67 y.o. msle who was seen today for physical therapy evaluation and treatment for M54.50  (ICD-10-CM) - Low back pain. Pt lives very active lifestyle but is limited in standing and prolonged activities in sitting and standing. Reports coccyx pain with prolonged sitting. Along with x-ray findings and Facet Hypertrophy, pt showing muscle imbalance in similar pattern of lower cross syndrome. Pt demonstrating limitations in functional endurance during ADLs, home activities requiring lifting, cleaning, and recreational due to low back pain. These limitations are evident by ROM restrictions, muscle weakness, poor postural awareness, and pain. Pt would benefit from skilled Physical Therapy services to address functional deficits and improve overall QOL.  OBJECTIVE IMPAIRMENTS: decreased activity tolerance, decreased mobility, decreased ROM, decreased strength, hypomobility, and pain.   ACTIVITY LIMITATIONS: carrying, lifting, bending, sitting, standing, and sleeping  PARTICIPATION LIMITATIONS: driving, shopping, community activity, occupation, and yard work  PERSONAL FACTORS: Age are also affecting patient's functional outcome.   REHAB POTENTIAL: Excellent  CLINICAL DECISION MAKING: Stable/uncomplicated  EVALUATION COMPLEXITY: Low   GOALS: Goals reviewed with patient? No  SHORT TERM GOALS: Target date: 01/03/2023  Pt will be independent with HEP in order to demonstrate participation in Physical Therapy POC.  Baseline: Goal status:  INITIAL  2.  Pt will report 'No pain' while performing functional recreational activities for at least 30 minutes in order to improve endurance capacity  Baseline:  Goal status: INITIAL  LONG TERM GOALS: Target date: 01/31/2023  Pt will improve hip extension strength by > 1/2 grade in order to improve functional strength during recreational activities.  Baseline: see objective  Goal status: INITIAL  2.  Pt will will improve hip abduction strength by > 1/2 grade in order to improve functional strength during recreational activities.  Baseline: see  objective  Goal status: INITIAL  3.  Pt will improve Modified Oswestry score by 5 points in order to demonstrate improved pain with functional goals and outcomes. Baseline: 9/50 Goal status: INITIAL  4.  Pt will report 0/10 pain while performing functional recreational activities for > 60 minutes in order to improve endurance capacity  Baseline: see objective  Goal status: INITIAL  5. Pt will improve hamstring extensibility by > 10 degrees in order to improve muscle mobility and improve performance during recreational events Baseline: see objective  Goal Status: INITIAL    PLAN:  PT FREQUENCY: 1x/week  PT DURATION: 8 weeks  PLANNED INTERVENTIONS: 97146- PT Re-evaluation, 97110-Therapeutic exercises, 97530- Therapeutic activity, 97112- Neuromuscular re-education, 97535- Self Care, 16109- Manual therapy, [id number]- Gait training, [phone]- Traction (mechanical), Patient/Family education, Dry Needling, Joint mobilization, Joint manipulation, and Spinal mobilization.  PLAN FOR NEXT SESSION: Heavy HEP emphasis with heavy core and hip strengthening, further discussion regarding coccyx pain and potential mobs. Discuss male kegel exercises as well.   Nelida Meuse PT, DPT Physical Therapist with [facility] 336 [phone] office   Nelida Meuse, PT 12/06/2022, 12:32 PM

## 2022-12-09 ENCOUNTER — Ambulatory Visit (HOSPITAL_COMMUNITY): Payer: Medicare Other

## 2022-12-09 DIAGNOSIS — M545 Low back pain, unspecified: Secondary | ICD-10-CM | POA: Diagnosis not present

## 2022-12-09 DIAGNOSIS — M6281 Muscle weakness (generalized): Secondary | ICD-10-CM

## 2022-12-09 NOTE — Therapy (Signed)
OUTPATIENT PHYSICAL THERAPY THORACOLUMBAR EVALUATION   Patient Name: Vincent Mendoza MRN: [id number] DOB:[birth year], 68 y.o., male Today's Date: 12/09/2022  END OF SESSION:  PT End of Session - 12/09/22 0808     Visit Number 2    Date for PT Re-Evaluation 01/31/23    Authorization Type Medicare part A & B; BCBS supplement secondary    Authorization Time Period no auth; no limit    Authorization - Visit Number 2    Progress Note Due on Visit 8    PT Start Time 0805    PT Stop Time 0845    PT Time Calculation (min) 40 min    Behavior During Therapy [facility] for tasks assessed/performed             Past Medical History:  Diagnosis Date   Acromioclavicular joint arthritis 12/13/2020   Diverticulosis    Gout    Hyperlipidemia    Hypertension    stopped taking Lisinopril after a week   Impingement syndrome of right shoulder 12/13/2020   Inguinal hernia    right   Kidney stone    Morton's neuroma of left foot    Myocardial infarction [facility]) 2020   with stent   Sleep apnea    Wears glasses    Past Surgical History:  Procedure Laterality Date   cardiac stent placement     COLONOSCOPY  2018   EXCISION MORTON'S NEUROMA Left 03/20/2017   Procedure: Left 3-4 Morton's Neuroma Excision;  Surgeon: Toni Arthurs, MD;  Location: [facility];  Service: Orthopedics;  Laterality: Left;   INGUINAL HERNIA REPAIR Bilateral 12/29/2014   Procedure: LAPAROSCOPIC BILATERAL INGUINAL HERNIA REPAIR;  Surgeon: Axel Filler, MD;  [facility];  Service: General;  Laterality: Bilateral;   INSERTION OF MESH Right 12/29/2014   Procedure: INSERTION OF MESH;  Surgeon: Axel Filler, MD;  [facility]: [facility];  Service: General;  Laterality: Right;   LAPAROSCOPY N/A 12/29/2014   Procedure: LAPAROSCOPY DIAGNOSTIC;  Surgeon: Axel Filler, MD;  [facility]: [facility];  Service: General;  Laterality: N/A;   LITHOTRIPSY     x3   LIVER BIOPSY N/A 12/29/2014   Procedure: LIVER BIOPSY;  Surgeon:  Axel Filler, MD;  [facility];  Service: General;  Laterality: N/A;   SHOULDER ARTHROSCOPY WITH BICEPS TENDON REPAIR  01/02/2021   Procedure: BICEPS TENDON REPAIR;  Surgeon: Salvatore Marvel, MD;  Location: [facility];  Service: Orthopedics;;   SHOULDER ARTHROSCOPY WITH ROTATOR CUFF REPAIR AND SUBACROMIAL DECOMPRESSION Right 01/02/2021   Procedure: SHOULDER ARTHROSCOPY WITH ROTATOR CUFF REPAIR AND SUBACROMIAL DECOMPRESSION, DISTAL CLAVICLE EXCISION;  Surgeon: Salvatore Marvel, MD;  Location: [facility];  Service: Orthopedics;  Laterality: Right;   TONSILLECTOMY     VASECTOMY     Patient Active Problem List   Diagnosis Date Noted   Other erythrocytosis 10/08/2021   Traumatic rotator cuff tear, right, subsequent encounter 12/13/2020   Impingement syndrome of right shoulder 12/13/2020   Acromioclavicular joint arthritis 12/13/2020   Erythrocytosis 11/18/2018   Non-restorative sleep 11/18/2018   Coronary artery disease involving native coronary artery of native heart with angina pectoris with documented spasm (HCC) 11/18/2018   Status post angioplasty with stent 11/18/2018   Excessive daytime sleepiness 11/18/2018   Snoring 11/18/2018   Hip pain, acute, unspecified laterality 11/18/2018   Diverticulitis of colon 05/18/2014    PCP: Chilton Greathouse MD  REFERRING PROVIDER: Windle Guard, MD  REFERRING DIAG: M54.50 (ICD-10-CM) - Low back pain  Rationale for Evaluation and Treatment: Rehabilitation  THERAPY DIAG:  Lumbar back pain  Muscle weakness (generalized)  ONSET DATE: Multiple years  SUBJECTIVE:                                                                                                                                                                                           SUBJECTIVE STATEMENT: Patient reports compliance with HEP for a couple of days; started with official personal training today (has virtual personal training); 1/10  pain in the back today.   Goes by "Vincent Mendoza"   Eval:Chronic problem over past few years. Notices standing for long times will increase pain. 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 Pain in coccyx area with prolonged sitting irritates it and feels like he has had fall on bottom but no fall. Getting ready to start on Personal trainer on Monday. 3.5 mile walk this morning. Prolonged rest in one position also increased pain. Side sleeper with at least 6-8 hours of sleep. Reports increased urgency in urination in recent time.  PERTINENT HISTORY:  Morton's Neuroma Stent Diuretics  PAIN:  Are you having pain? Yes: NPRS scale: 2/10 Pain location: low back Pain description: achy Aggravating factors: long term work Relieving factors: resting   PRECAUTIONS: None  RED FLAGS: None   WEIGHT BEARING RESTRICTIONS: No  FALLS:  Has patient fallen in last 6 months? No   OCCUPATION: Retired vertinarian   PLOF: Independent  PATIENT GOALS: control pain  NEXT MD VISIT: no followup yet  OBJECTIVE:  Note: Objective measures were completed at Evaluation unless otherwise noted.  DIAGNOSTIC FINDINGS:  X-ray lumbar spine (10/18/2022): 5 lumbar-type vertebral bodies without significant scoliosis. No high-grade listhesis. Disc space heights well-maintained. Facet arthrosis at L4-5 and L5-S1. Mildly exaggerated lumbar lordosis. No significant displacement of the coccyx.   PATIENT SURVEYS:  Modified Oswestry 9/50   COGNITION: Overall cognitive status: Within functional limits for tasks assessed     SENSATION: WFL  MUSCLE LENGTH: Hamstrings: Right 65 deg; Left 55 deg and painful Thomas test: next sesion  POSTURE: rounded shoulders, forward head, increased lumbar lordosis, and anterior pelvic tilt  PALPATION: TTP along lumbar paraspinals and increased tension note through paraspinals  LUMBAR ROM:   AROM eval  Flexion 50%  Extension 75%  and painful  Right lateral flexion 75% and painful  Left lateral flexion 75% and painful  Right rotation 75% and painful  Left rotation 75% and painful   (Blank rows = not tested)  LOWER EXTREMITY ROM:     Passive  Right eval Left eval  Hip flexion  Hip extension    Hip abduction    Hip adduction    Hip internal rotation 18 15  Hip external rotation [facility]  Knee flexion    Knee extension    Ankle dorsiflexion    Ankle plantarflexion    Ankle inversion    Ankle eversion     (Blank rows = not tested)  LOWER EXTREMITY MMT:    MMT Right eval Left eval  Hip flexion 4- 4-  Hip extension 3+ 3+  Hip abduction 4- 4-  Hip adduction    Hip internal rotation    Hip external rotation    Knee flexion    Knee extension    Ankle dorsiflexion    Ankle plantarflexion    Ankle inversion    Ankle eversion     (Blank rows = not tested)  LUMBAR SPECIAL TESTS:  Seated extension rotation test Marden Noble test): Positive  GAIT: Distance walked: 53ft Assistive device utilized: None Level of assistance: Complete Independence Comments: mild bilateral trendelenberg with anterior pelvic tilt  TODAY'S TREATMENT:                                                                                                                              DATE:  12/09/2022 Review HEP and goals Seated hamstring stretch 5 x 20" Physioball abdominal bracing 5" hold x 10  Supine: Posterior pelvic tilt 5" hold x 10 Abdominal bracing 5" x 10 Abdominal bracing with bridge x 10  Sidelying  Clam RTB x 10 each     12/06/2022 PT evaluation, findings, frequency, prognosis    PATIENT EDUCATION:  Education details: Findings, prognosis, discussion of relation between back pain and pelvic floor pain. Discussed coccyx pain as well. P Person educated: Patient Education method: Explanation, Demonstration, Tactile cues, Verbal cues, and Handouts Education comprehension: verbalized understanding and returned  demonstration  HOME EXERCISE PROGRAM: Access Code: [email or web address] URL: [email or web address] Date: 12/06/2022 Prepared by: Starling Manns  Exercises - Supine Posterior Pelvic Tilt  - 1 x daily - 7 x weekly - 3 sets - 10 reps - Seated Hamstring Stretch  - 1 x daily - 7 x weekly - 3 sets - 10 reps  ASSESSMENT:  CLINICAL IMPRESSION: Today's session started with a review of HEP and goals.  Patient verbalizes agreement with  set rehab goals.  Needs cues for proper breathing and pacing with exercises. Progressed core strengthening today and updated HEP.  Patient will benefit from continued skilled therapy services to address deficits and promote return to optimal function.        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 Pt lives very active lifestyle but is limited in standing and prolonged activities in sitting and standing. Reports coccyx pain with prolonged sitting. Along with x-ray findings and Facet Hypertrophy, pt showing muscle imbalance in similar pattern of lower cross syndrome. Pt demonstrating limitations  in functional endurance during ADLs, home activities requiring lifting, cleaning, and recreational due to low back pain. These limitations are evident by ROM restrictions, muscle weakness, poor postural awareness, and pain. Pt would benefit from skilled Physical Therapy services to address functional deficits and improve overall QOL.  OBJECTIVE IMPAIRMENTS: decreased activity tolerance, decreased mobility, decreased ROM, decreased strength, hypomobility, and pain.   ACTIVITY LIMITATIONS: carrying, lifting, bending, sitting, standing, and sleeping  PARTICIPATION LIMITATIONS: driving, shopping, community activity, occupation, and yard work  PERSONAL FACTORS: Age are also affecting patient's functional outcome.   REHAB POTENTIAL: Excellent  CLINICAL DECISION MAKING:  Stable/uncomplicated  EVALUATION COMPLEXITY: Low   GOALS: Goals reviewed with patient? No  SHORT TERM GOALS: Target date: 01/03/2023  Pt will be independent with HEP in order to demonstrate participation in Physical Therapy POC.  Baseline: Goal status: in progress 2.  Pt will report 'No pain' while performing functional recreational activities for at least 30 minutes in order to improve endurance capacity  Baseline:  Goal status: in progress  LONG TERM GOALS: Target date: 01/31/2023  Pt will improve hip extension strength by > 1/2 grade in order to improve functional strength during recreational activities.  Baseline: see objective  Goal status:in progress  2.  Pt will will improve hip abduction strength by > 1/2 grade in order to improve functional strength during recreational activities.  Baseline: see objective  Goal status: in progress   3.  Pt will improve Modified Oswestry score by 5 points in order to demonstrate improved pain with functional goals and outcomes. Baseline: 9/50 Goal status: in progress  4.  Pt will report 0/10 pain while performing functional recreational activities for > 60 minutes in order to improve endurance capacity  Baseline: see objective  Goal status: in progress   5. Pt will improve hamstring extensibility by > 10 degrees in order to improve muscle mobility and improve performance during recreational events Baseline: see objective  Goal Status: in progress   PLAN:  PT FREQUENCY: 1x/week  PT DURATION: 8 weeks  PLANNED INTERVENTIONS: 97146- PT Re-evaluation, 97110-Therapeutic exercises, 97530- Therapeutic activity, 97112- Neuromuscular re-education, 97535- Self Care, 95284- Manual therapy, [id number]- Gait training, [phone]- Traction (mechanical), Patient/Family education, Dry Needling, Joint mobilization, Joint manipulation, and Spinal mobilization.  PLAN FOR NEXT SESSION: Heavy HEP emphasis with heavy core and hip strengthening, further  discussion regarding coccyx pain and potential mobs. Discuss male kegel exercises as well.     8:46 AM, 12/09/22 Emberlyn Burlison Small Denine Brotz MPT [place] physical [facility]  [phone]

## 2022-12-10 ENCOUNTER — Encounter (HOSPITAL_COMMUNITY): Payer: BLUE CROSS/BLUE SHIELD

## 2022-12-11 ENCOUNTER — Encounter (HOSPITAL_COMMUNITY): Payer: Self-pay

## 2022-12-11 ENCOUNTER — Other Ambulatory Visit (HOSPITAL_COMMUNITY): Payer: Self-pay

## 2022-12-12 ENCOUNTER — Other Ambulatory Visit: Payer: Self-pay | Admitting: Internal Medicine

## 2022-12-12 ENCOUNTER — Other Ambulatory Visit (HOSPITAL_COMMUNITY): Payer: Self-pay

## 2022-12-13 ENCOUNTER — Other Ambulatory Visit (HOSPITAL_COMMUNITY): Payer: Self-pay

## 2022-12-17 ENCOUNTER — Ambulatory Visit (HOSPITAL_COMMUNITY): Payer: Medicare Other

## 2022-12-17 DIAGNOSIS — M545 Low back pain, unspecified: Secondary | ICD-10-CM

## 2022-12-17 DIAGNOSIS — M6281 Muscle weakness (generalized): Secondary | ICD-10-CM

## 2022-12-17 NOTE — Therapy (Signed)
OUTPATIENT PHYSICAL THERAPY THORACOLUMBAR TREATMENT   Patient Name: Vincent Mendoza MRN: [id number] DOB:04-Jul-1955, 67 y.o., male Today's Date: 12/17/2022  END OF SESSION:  PT End of Session - 12/17/22 0807     Visit Number 3    Date for PT Re-Evaluation 01/31/23    Authorization Type Medicare part A & B; BCBS supplement secondary    Authorization Time Period no auth; no limit    Authorization - Visit Number 3    Progress Note Due on Visit 8    PT Start Time 0807    PT Stop Time 0845    PT Time Calculation (min) 38 min    Behavior During Therapy [facility] for tasks assessed/performed             Past Medical History:  Diagnosis Date   Acromioclavicular joint arthritis 12/13/2020   Diverticulosis    Gout    Hyperlipidemia    Hypertension    stopped taking Lisinopril after a week   Impingement syndrome of right shoulder 12/13/2020   Inguinal hernia    right   Kidney stone    Morton's neuroma of left foot    Myocardial infarction [facility]) 2020   with stent   Sleep apnea    Wears glasses    Past Surgical History:  Procedure Laterality Date   cardiac stent placement     COLONOSCOPY  2018   EXCISION MORTON'S NEUROMA Left 03/20/2017   Procedure: Left 3-4 Morton's Neuroma Excision;  Surgeon: Toni Arthurs, MD;  Location: [facility];  Service: Orthopedics;  Laterality: Left;   INGUINAL HERNIA REPAIR Bilateral 12/29/2014   Procedure: LAPAROSCOPIC BILATERAL INGUINAL HERNIA REPAIR;  Surgeon: Axel Filler, MD;  Location: [facility] OR;  Service: General;  Laterality: Bilateral;   INSERTION OF MESH Right 12/29/2014   Procedure: INSERTION OF MESH;  Surgeon: Axel Filler, MD;  Location: [facility];  Service: General;  Laterality: Right;   LAPAROSCOPY N/A 12/29/2014   Procedure: LAPAROSCOPY DIAGNOSTIC;  Surgeon: Axel Filler, MD;  Location: [facility];  Service: General;  Laterality: N/A;   LITHOTRIPSY     x3   LIVER BIOPSY N/A 12/29/2014   Procedure: LIVER BIOPSY;  Surgeon:  Axel Filler, MD;  Location: [facility];  Service: General;  Laterality: N/A;   SHOULDER ARTHROSCOPY WITH BICEPS TENDON REPAIR  01/02/2021   Procedure: BICEPS TENDON REPAIR;  Surgeon: Salvatore Marvel, MD;  Location: [facility];  Service: Orthopedics;;   SHOULDER ARTHROSCOPY WITH ROTATOR CUFF REPAIR AND SUBACROMIAL DECOMPRESSION Right 01/02/2021   Procedure: SHOULDER ARTHROSCOPY WITH ROTATOR CUFF REPAIR AND SUBACROMIAL DECOMPRESSION, DISTAL CLAVICLE EXCISION;  Surgeon: Salvatore Marvel, MD;  Location: [facility];  Service: Orthopedics;  Laterality: Right;   TONSILLECTOMY     VASECTOMY     Patient Active Problem List   Diagnosis Date Noted   Other erythrocytosis 10/08/2021   Traumatic rotator cuff tear, right, subsequent encounter 12/13/2020   Impingement syndrome of right shoulder 12/13/2020   Acromioclavicular joint arthritis 12/13/2020   Erythrocytosis 11/18/2018   Non-restorative sleep 11/18/2018   Coronary artery disease involving native coronary artery of native heart with angina pectoris with documented spasm (HCC) 11/18/2018   Status post angioplasty with stent 11/18/2018   Excessive daytime sleepiness 11/18/2018   Snoring 11/18/2018   Hip pain, acute, unspecified laterality 11/18/2018   Diverticulitis of colon 05/18/2014    PCP: Vincent Greathouse MD  REFERRING PROVIDER: Windle Guard, MD  REFERRING DIAG: M54.50 (ICD-10-CM) - Low back pain  Rationale for Evaluation and Treatment: Rehabilitation  THERAPY DIAG:  Lumbar back pain  Muscle weakness (generalized)  ONSET DATE: Multiple years  SUBJECTIVE:                                                                                                                                                                                           SUBJECTIVE STATEMENT: 1/10 pain in back today; started personal training without issue. Haven't done exercises every day but is getting them in most days.  Leaving to  go to the beach this afternoon.    Goes by "Vincent Mendoza"   Eval:Chronic problem over past few years. Notices standing for long times will increase pain. 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 Pain in coccyx area with prolonged sitting irritates it and feels like he has had fall on bottom but no fall. Getting ready to start on Personal trainer on Monday. 3.5 mile walk this morning. Prolonged rest in one position also increased pain. Side sleeper with at least 6-8 hours of sleep. Reports increased urgency in urination in recent time.  PERTINENT HISTORY:  Morton's Neuroma Stent Diuretics  PAIN:  Are you having pain? Yes: NPRS scale: 2/10 Pain location: low back Pain description: achy Aggravating factors: long term work Relieving factors: resting   PRECAUTIONS: None  RED FLAGS: None   WEIGHT BEARING RESTRICTIONS: No  FALLS:  Has patient fallen in last 6 months? No   OCCUPATION: Retired vertinarian   PLOF: Independent  PATIENT GOALS: control pain  NEXT MD VISIT: no followup yet  OBJECTIVE:  Note: Objective measures were completed at Evaluation unless otherwise noted.  DIAGNOSTIC FINDINGS:  X-ray lumbar spine (10/18/2022): 5 lumbar-type vertebral bodies without significant scoliosis. No high-grade listhesis. Disc space heights well-maintained. Facet arthrosis at L4-5 and L5-S1. Mildly exaggerated lumbar lordosis. No significant displacement of the coccyx.   PATIENT SURVEYS:  Modified Oswestry 9/50   COGNITION: Overall cognitive status: Within functional limits for tasks assessed     SENSATION: WFL  MUSCLE LENGTH: Hamstrings: Right 65 deg; Left 55 deg and painful Thomas test: next sesion  POSTURE: rounded shoulders, forward head, increased lumbar lordosis, and anterior pelvic tilt  PALPATION: TTP along lumbar paraspinals and increased tension note through paraspinals  LUMBAR ROM:   AROM eval  Flexion 50%   Extension 75% and painful  Right lateral flexion 75% and painful  Left lateral flexion 75% and painful  Right rotation 75% and painful  Left rotation 75% and painful   (Blank rows = not tested)  LOWER EXTREMITY ROM:     Passive  Right eval  Left eval  Hip flexion    Hip extension    Hip abduction    Hip adduction    Hip internal rotation 18 15  Hip external rotation [facility]  Knee flexion    Knee extension    Ankle dorsiflexion    Ankle plantarflexion    Ankle inversion    Ankle eversion     (Blank rows = not tested)  LOWER EXTREMITY MMT:    MMT Right eval Left eval  Hip flexion 4- 4-  Hip extension 3+ 3+  Hip abduction 4- 4-  Hip adduction    Hip internal rotation    Hip external rotation    Knee flexion    Knee extension    Ankle dorsiflexion    Ankle plantarflexion    Ankle inversion    Ankle eversion     (Blank rows = not tested)  LUMBAR SPECIAL TESTS:  Seated extension rotation test Marden Noble test): Positive  GAIT: Distance walked: 53ft Assistive device utilized: None Level of assistance: Complete Independence Comments: mild bilateral trendelenberg with anterior pelvic tilt  TODAY'S TREATMENT:                                                                                                                              DATE:  12/17/22 Supine:  Hamstring stretch 5 x 20" Abdominal bracing 5" hold x 10 Abdominal bracing with bridge x 10 Abdominal bracing with ball squeeze and bridge x 10 Abdominal bracing with Clamshell GTB x 10  Dead Bug with GTB pull apart UE and Leg Ext x10 Bilateral     12/09/2022 Review HEP and goals Seated hamstring stretch 5 x 20" Physioball abdominal bracing 5" hold x 10  Supine: Posterior pelvic tilt 5" hold x 10 Abdominal bracing 5" x 10 Abdominal bracing with bridge x 10  Sidelying  Clam RTB x 10 each     12/06/2022 PT evaluation, findings, frequency, prognosis    PATIENT EDUCATION:  Education details:  Findings, prognosis, discussion of relation between back pain and pelvic floor pain. Discussed coccyx pain as well. P Person educated: Patient Education method: Explanation, Demonstration, Tactile cues, Verbal cues, and Handouts Education comprehension: verbalized understanding and returned demonstration  HOME EXERCISE PROGRAM: Access Code: [email or web address] URL: [email or web address] Date: 12/06/2022 Prepared by: Starling Manns  Exercises - Supine Posterior Pelvic Tilt  - 1 x daily - 7 x weekly - 3 sets - 10 reps - Seated Hamstring Stretch  - 1 x daily - 7 x weekly - 3 sets - 10 reps  ASSESSMENT:  CLINICAL IMPRESSION: Today's session continued with focus on core strengthening and spinal mobility.  Progressed core strengthening today with good challenge.  Patient needs cues for proper form and technique; breathing throughout exercise.  Updated HEP with new exercises from today's session.  Patient will benefit from continued skilled therapy services to address deficits and promote return to optimal function.        Eval:Patient is  a 67 y.o. msle who was seen today for physical therapy evaluation and treatment for M54.50 (ICD-10-CM) - Low back pain. Pt lives very active lifestyle but is limited in standing and prolonged activities in sitting and standing. Reports coccyx pain with prolonged sitting. Along with x-ray findings and Facet Hypertrophy, pt showing muscle imbalance in similar pattern of lower cross syndrome. Pt demonstrating limitations in functional endurance during ADLs, home activities requiring lifting, cleaning, and recreational due to low back pain. These limitations are evident by ROM restrictions, muscle weakness, poor postural awareness, and pain. Pt would benefit from skilled Physical Therapy services to address functional deficits and improve overall QOL.  OBJECTIVE IMPAIRMENTS: decreased activity tolerance, decreased mobility, decreased ROM, decreased strength,  hypomobility, and pain.   ACTIVITY LIMITATIONS: carrying, lifting, bending, sitting, standing, and sleeping  PARTICIPATION LIMITATIONS: driving, shopping, community activity, occupation, and yard work  PERSONAL FACTORS: Age are also affecting patient's functional outcome.   REHAB POTENTIAL: Excellent  CLINICAL DECISION MAKING: Stable/uncomplicated  EVALUATION COMPLEXITY: Low   GOALS: Goals reviewed with patient? No  SHORT TERM GOALS: Target date: 01/03/2023  Pt will be independent with HEP in order to demonstrate participation in Physical Therapy POC.  Baseline: Goal status: in progress 2.  Pt will report 'No pain' while performing functional recreational activities for at least 30 minutes in order to improve endurance capacity  Baseline:  Goal status: in progress  LONG TERM GOALS: Target date: 01/31/2023  Pt will improve hip extension strength by > 1/2 grade in order to improve functional strength during recreational activities.  Baseline: see objective  Goal status:in progress  2.  Pt will will improve hip abduction strength by > 1/2 grade in order to improve functional strength during recreational activities.  Baseline: see objective  Goal status: in progress   3.  Pt will improve Modified Oswestry score by 5 points in order to demonstrate improved pain with functional goals and outcomes. Baseline: 9/50 Goal status: in progress  4.  Pt will report 0/10 pain while performing functional recreational activities for > 60 minutes in order to improve endurance capacity  Baseline: see objective  Goal status: in progress   5. Pt will improve hamstring extensibility by > 10 degrees in order to improve muscle mobility and improve performance during recreational events Baseline: see objective  Goal Status: in progress   PLAN:  PT FREQUENCY: 1x/week  PT DURATION: 8 weeks  PLANNED INTERVENTIONS: 97146- PT Re-evaluation, 97110-Therapeutic exercises, 97530- Therapeutic  activity, 97112- Neuromuscular re-education, 97535- Self Care, 08657- Manual therapy, [id number]- Gait training, [phone]- Traction (mechanical), Patient/Family education, Dry Needling, Joint mobilization, Joint manipulation, and Spinal mobilization.  PLAN FOR NEXT SESSION: Heavy HEP emphasis with heavy core and hip strengthening, further discussion regarding coccyx pain and potential mobs. Discuss male kegel exercises as well.     8:47 AM, 12/17/22 Quan Cybulski [place] MPT [facility] [place] [phone]

## 2022-12-31 ENCOUNTER — Other Ambulatory Visit (HOSPITAL_COMMUNITY): Payer: Self-pay

## 2022-12-31 MED ORDER — WEGOVY 1 MG/0.5ML ~~LOC~~ SOAJ
1.0000 mg | SUBCUTANEOUS | 1 refills | Status: AC
  Filled 2022-12-31: qty 2, 28d supply, fill #0
  Filled 2023-01-26: qty 2, 28d supply, fill #1

## 2023-01-01 ENCOUNTER — Ambulatory Visit (HOSPITAL_COMMUNITY): Payer: Medicare Other | Attending: Anesthesiology

## 2023-01-01 DIAGNOSIS — M6281 Muscle weakness (generalized): Secondary | ICD-10-CM

## 2023-01-01 DIAGNOSIS — M545 Low back pain, unspecified: Secondary | ICD-10-CM | POA: Diagnosis present

## 2023-01-01 NOTE — Therapy (Signed)
OUTPATIENT PHYSICAL THERAPY THORACOLUMBAR TREATMENT   Patient Name: Vincent Mendoza MRN: [id number] DOB:12-15-1955, 67 y.o., male Today's Date: 01/01/2023  END OF SESSION:  PT End of Session - 01/01/23 0804     Visit Number 4    Date for PT Re-Evaluation 01/31/23    Authorization Type Medicare part A & B; BCBS supplement secondary    Authorization Time Period no auth; no limit    Progress Note Due on Visit 8    PT Start Time 0805    PT Stop Time 0845    PT Time Calculation (min) 40 min    Behavior During Therapy [facility] for tasks assessed/performed              Past Medical History:  Diagnosis Date   Acromioclavicular joint arthritis 12/13/2020   Diverticulosis    Gout    Hyperlipidemia    Hypertension    stopped taking Lisinopril after a week   Impingement syndrome of right shoulder 12/13/2020   Inguinal hernia    right   Kidney stone    Morton's neuroma of left foot    Myocardial infarction [facility]) 2020   with stent   Sleep apnea    Wears glasses    Past Surgical History:  Procedure Laterality Date   cardiac stent placement     COLONOSCOPY  2018   EXCISION MORTON'S NEUROMA Left 03/20/2017   Procedure: Left 3-4 Morton's Neuroma Excision;  Surgeon: Toni Arthurs, MD;  Location: [facility];  Service: Orthopedics;  Laterality: Left;   INGUINAL HERNIA REPAIR Bilateral 12/29/2014   Procedure: LAPAROSCOPIC BILATERAL INGUINAL HERNIA REPAIR;  Surgeon: Axel Filler, MD;  Location: [facility];  Service: General;  Laterality: Bilateral;   INSERTION OF MESH Right 12/29/2014   Procedure: INSERTION OF MESH;  Surgeon: Axel Filler, MD;  Location: [facility];  Service: General;  Laterality: Right;   LAPAROSCOPY N/A 12/29/2014   Procedure: LAPAROSCOPY DIAGNOSTIC;  Surgeon: Axel Filler, MD;  Location: [facility];  Service: General;  Laterality: N/A;   LITHOTRIPSY     x3   LIVER BIOPSY N/A 12/29/2014   Procedure: LIVER BIOPSY;  Surgeon: Axel Filler, MD;  Location: [facility];   Service: General;  Laterality: N/A;   SHOULDER ARTHROSCOPY WITH BICEPS TENDON REPAIR  01/02/2021   Procedure: BICEPS TENDON REPAIR;  Surgeon: Salvatore Marvel, MD;  Location: [facility];  Service: Orthopedics;;   SHOULDER ARTHROSCOPY WITH ROTATOR CUFF REPAIR AND SUBACROMIAL DECOMPRESSION Right 01/02/2021   Procedure: SHOULDER ARTHROSCOPY WITH ROTATOR CUFF REPAIR AND SUBACROMIAL DECOMPRESSION, DISTAL CLAVICLE EXCISION;  Surgeon: Salvatore Marvel, MD;  Location: [facility];  Service: Orthopedics;  Laterality: Right;   TONSILLECTOMY     VASECTOMY     Patient Active Problem List   Diagnosis Date Noted   Other erythrocytosis 10/08/2021   Traumatic rotator cuff tear, right, subsequent encounter 12/13/2020   Impingement syndrome of right shoulder 12/13/2020   Acromioclavicular joint arthritis 12/13/2020   Erythrocytosis 11/18/2018   Non-restorative sleep 11/18/2018   Coronary artery disease involving native coronary artery of native heart with angina pectoris with documented spasm (HCC) 11/18/2018   Status post angioplasty with stent 11/18/2018   Excessive daytime sleepiness 11/18/2018   Snoring 11/18/2018   Hip pain, acute, unspecified laterality 11/18/2018   Diverticulitis of colon 05/18/2014    PCP: Chilton Greathouse MD  REFERRING PROVIDER: Windle Guard, MD  REFERRING DIAG: M54.50 (ICD-10-CM) - Low back pain   Rationale for Evaluation and Treatment: Rehabilitation  THERAPY DIAG:  Lumbar back pain  Muscle weakness (generalized)  ONSET DATE: Multiple years  SUBJECTIVE:                                                                                                                                                                                           SUBJECTIVE STATEMENT: Not having any pain today. Feel pretty comfortable. Started back with his personal trainer this earlier this week after his fishing trip. Feels a little sore after that workout but  tolerable. Occasionally felt some back pain during the fishing trip, but not every day. Stayed between 1-3/10. I headed to do another gym workout later today off the program his personal trainer made for him. Hasn't been consistent with HEP due to fishing trip and travel.   Goes by "Vincent Mendoza"   Eval:Chronic problem over past few years. Notices standing for long times will increase pain. 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 Pain in coccyx area with prolonged sitting irritates it and feels like he has had fall on bottom but no fall. Getting ready to start on Personal trainer on Monday. 3.5 mile walk this morning. Prolonged rest in one position also increased pain. Side sleeper with at least 6-8 hours of sleep. Reports increased urgency in urination in recent time.  PERTINENT HISTORY:  Morton's Neuroma Stent Diuretics  PAIN:  Are you having pain? Yes: NPRS scale: 2/10 Pain location: low back Pain description: achy Aggravating factors: long term work Relieving factors: resting   PRECAUTIONS: None  RED FLAGS: None   WEIGHT BEARING RESTRICTIONS: No  FALLS:  Has patient fallen in last 6 months? No   OCCUPATION: Retired vertinarian   PLOF: Independent  PATIENT GOALS: control pain  NEXT MD VISIT: no followup yet  OBJECTIVE:  Note: Objective measures were completed at Evaluation unless otherwise noted.  DIAGNOSTIC FINDINGS:  X-ray lumbar spine (10/18/2022): 5 lumbar-type vertebral bodies without significant scoliosis. No high-grade listhesis. Disc space heights well-maintained. Facet arthrosis at L4-5 and L5-S1. Mildly exaggerated lumbar lordosis. No significant displacement of the coccyx.   PATIENT SURVEYS:  Modified Oswestry 9/50   COGNITION: Overall cognitive status: Within functional limits for tasks assessed     SENSATION: WFL  MUSCLE LENGTH: Hamstrings: Right 65 deg; Left 55 deg and painful Thomas test: next  sesion  POSTURE: rounded shoulders, forward head, increased lumbar lordosis, and anterior pelvic tilt  PALPATION: TTP along lumbar paraspinals and increased tension note through paraspinals  LUMBAR ROM:   AROM eval  Flexion 50%  Extension 75% and painful  Right lateral flexion 75% and painful  Left lateral flexion 75% and painful  Right rotation 75% and painful  Left rotation 75% and painful   (Blank rows = not tested)  LOWER EXTREMITY ROM:     Passive  Right eval Left eval  Hip flexion    Hip extension    Hip abduction    Hip adduction    Hip internal rotation 18 15  Hip external rotation [facility]  Knee flexion    Knee extension    Ankle dorsiflexion    Ankle plantarflexion    Ankle inversion    Ankle eversion     (Blank rows = not tested)  LOWER EXTREMITY MMT:    MMT Right eval Left eval  Hip flexion 4- 4-  Hip extension 3+ 3+  Hip abduction 4- 4-  Hip adduction    Hip internal rotation    Hip external rotation    Knee flexion    Knee extension    Ankle dorsiflexion    Ankle plantarflexion    Ankle inversion    Ankle eversion     (Blank rows = not tested)  LUMBAR SPECIAL TESTS:  Seated extension rotation test Marden Noble test): Positive  GAIT: Distance walked: 51ft Assistive device utilized: None Level of assistance: Complete Independence Comments: mild bilateral trendelenberg with anterior pelvic tilt  TODAY'S TREATMENT:                                                                                                                              DATE:  01/01/23 Warm up on [employer] bike 5' Abdominal bracing synced with breathing (tight core following exhale) 5x5" hold Abdominal bracing with bridge synced with breathing 10x 3" hold Abdominal bracing with ball squeeze and bridge 10x 3" hold Abdominal bracing with GTB Clamshell and bridge 10x 3" hold Dead Bug with GTB pull apart UE and Leg Ext x10 Bilateral    12/17/22 Supine:  Hamstring stretch 5 x  20" Abdominal bracing 5" hold x 10 Abdominal bracing with bridge x 10 Abdominal bracing with ball squeeze and bridge x 10 Abdominal bracing with Clamshell GTB x 10  Dead Bug with GTB pull apart UE and Leg Ext x10 Bilateral     12/09/2022 Review HEP and goals Seated hamstring stretch 5 x 20" Physioball abdominal bracing 5" hold x 10  Supine: Posterior pelvic tilt 5" hold x 10 Abdominal bracing 5" x 10 Abdominal bracing with bridge x 10  Sidelying  Clam RTB x 10 each     12/06/2022 PT evaluation, findings, frequency, prognosis    PATIENT EDUCATION:  Education details: Findings, prognosis, discussion of relation between back pain and pelvic floor pain. Discussed coccyx pain as well. P Person educated: Patient Education method: Explanation, Demonstration, Tactile cues, Verbal cues, and Handouts Education comprehension: verbalized understanding and returned demonstration  HOME EXERCISE PROGRAM: Access Code: [email or web address] URL: [email or web address] Date: 01/01/2023 Prepared by: [facility] - Rehab  Exercises - Supine Posterior Pelvic Tilt  - 1 x daily -  7 x weekly - 3 sets - 10 reps - Seated Hamstring Stretch  - 1 x daily - 7 x weekly - 3 sets - 10 reps - Hooklying Clamshell with Resistance  - 2 x daily - 7 x weekly - 1 sets - 10 reps - Supine Transversus Abdominis Bracing - Hands on Stomach  - 1 x daily - 7 x weekly - 1 sets - 10 reps - 5 sec hold - Supine Bridge  - 1 x daily - 7 x weekly - 1 sets - 10 reps - 5 sec hold - Supine Bridge with Mini Swiss Ball Between Knees  - 1 x daily - 7 x weekly - 1 sets - 10 reps - Supine Dead Bug with Leg Extension and Band Brace  - 1 x daily - 7 x weekly - 1 sets - 10 reps - Alternating Single Leg Bridge  - 1 x daily - 7 x weekly - 1 sets - 10 reps  ASSESSMENT:  CLINICAL IMPRESSION: Today's session started reviewing patient's HEP management, and his back's response during activity on his fishing trip at the beach.  He's showing  good improvement with activity tolerance and is motivated to improve his physical activity level. Introduced syncing breathing with some exercises and added a bridge with a single leg kick out.  Patient needed cues for proper form and technique as well as to keep breathing throughout exercise.  Updated HEP with new exercises from today's session.  Patient will benefit from continued skilled therapy services to address deficits and promote return to optimal function.      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 Pt lives very active lifestyle but is limited in standing and prolonged activities in sitting and standing. Reports coccyx pain with prolonged sitting. Along with x-ray findings and Facet Hypertrophy, pt showing muscle imbalance in similar pattern of lower cross syndrome. Pt demonstrating limitations in functional endurance during ADLs, home activities requiring lifting, cleaning, and recreational due to low back pain. These limitations are evident by ROM restrictions, muscle weakness, poor postural awareness, and pain. Pt would benefit from skilled Physical Therapy services to address functional deficits and improve overall QOL.  OBJECTIVE IMPAIRMENTS: decreased activity tolerance, decreased mobility, decreased ROM, decreased strength, hypomobility, and pain.   ACTIVITY LIMITATIONS: carrying, lifting, bending, sitting, standing, and sleeping  PARTICIPATION LIMITATIONS: driving, shopping, community activity, occupation, and yard work  PERSONAL FACTORS: Age are also affecting patient's functional outcome.   REHAB POTENTIAL: Excellent  CLINICAL DECISION MAKING: Stable/uncomplicated  EVALUATION COMPLEXITY: Low   GOALS: Goals reviewed with patient? No  SHORT TERM GOALS: Target date: 01/03/2023  Pt will be independent with HEP in order to demonstrate participation in Physical Therapy POC.   Baseline: Goal status: in progress 2.  Pt will report 'No pain' while performing functional recreational activities for at least 30 minutes in order to improve endurance capacity  Baseline:  Goal status: in progress  LONG TERM GOALS: Target date: 01/31/2023  Pt will improve hip extension strength by > 1/2 grade in order to improve functional strength during recreational activities.  Baseline: see objective  Goal status:in progress  2.  Pt will will improve hip abduction strength by > 1/2 grade in order to improve functional strength during recreational activities.  Baseline: see objective  Goal status: in progress   3.  Pt will improve Modified Oswestry score by 5 points in order  to demonstrate improved pain with functional goals and outcomes. Baseline: 9/50 Goal status: in progress  4.  Pt will report 0/10 pain while performing functional recreational activities for > 60 minutes in order to improve endurance capacity  Baseline: see objective  Goal status: in progress   5. Pt will improve hamstring extensibility by > 10 degrees in order to improve muscle mobility and improve performance during recreational events Baseline: see objective  Goal Status: in progress   PLAN:  PT FREQUENCY: 1x/week  PT DURATION: 8 weeks  PLANNED INTERVENTIONS: 97146- PT Re-evaluation, 97110-Therapeutic exercises, 97530- Therapeutic activity, 97112- Neuromuscular re-education, 97535- Self Care, 56213- Manual therapy, [id number]- Gait training, [id number]- Traction (mechanical), Patient/Family education, Dry Needling, Joint mobilization, Joint manipulation, and Spinal mobilization.  PLAN FOR NEXT SESSION: Follow up in regards to HEP compliance and warm up prior to personal training workout. Progress core exercises as necessary. Try sitting on physio ball to gage patient's awareness and control of sitting posture.     8:05 AM, 01/01/23 Artrell Lawless Small Tieler Cournoyer MPT [facility] [place]  [phone]

## 2023-01-02 ENCOUNTER — Other Ambulatory Visit (HOSPITAL_COMMUNITY): Payer: Self-pay

## 2023-01-08 ENCOUNTER — Encounter (HOSPITAL_COMMUNITY): Payer: BLUE CROSS/BLUE SHIELD

## 2023-01-10 ENCOUNTER — Ambulatory Visit (HOSPITAL_COMMUNITY): Payer: Medicare Other

## 2023-01-10 DIAGNOSIS — M545 Low back pain, unspecified: Secondary | ICD-10-CM | POA: Diagnosis not present

## 2023-01-10 DIAGNOSIS — M6281 Muscle weakness (generalized): Secondary | ICD-10-CM

## 2023-01-10 NOTE — Therapy (Cosign Needed)
OUTPATIENT PHYSICAL THERAPY THORACOLUMBAR TREATMENT   Patient Name: Vincent Mendoza MRN: [id number] DOB:03/22/1955, 67 y.o., male Today's Date: 01/10/2023  END OF SESSION:  PT End of Session - 01/10/23 0934     Visit Number 5    Number of Visits 8    Date for PT Re-Evaluation 01/31/23    Authorization Type Medicare part A & B; BCBS supplement secondary    Authorization Time Period no auth; no limit    Progress Note Due on Visit 8    PT Start Time 0935    PT Stop Time 1015    PT Time Calculation (min) 40 min    Behavior During Therapy [facility] for tasks assessed/performed              Past Medical History:  Diagnosis Date   Acromioclavicular joint arthritis 12/13/2020   Diverticulosis    Gout    Hyperlipidemia    Hypertension    stopped taking Lisinopril after a week   Impingement syndrome of right shoulder 12/13/2020   Inguinal hernia    right   Kidney stone    Morton's neuroma of left foot    Myocardial infarction [facility]) 2020   with stent   Sleep apnea    Wears glasses    Past Surgical History:  Procedure Laterality Date   cardiac stent placement     COLONOSCOPY  2018   EXCISION MORTON'S NEUROMA Left 03/20/2017   Procedure: Left 3-4 Morton's Neuroma Excision;  Surgeon: Toni Arthurs, MD;  Location: [facility];  Service: Orthopedics;  Laterality: Left;   INGUINAL HERNIA REPAIR Bilateral 12/29/2014   Procedure: LAPAROSCOPIC BILATERAL INGUINAL HERNIA REPAIR;  Surgeon: Axel Filler, MD;  Location: [facility];  Service: General;  Laterality: Bilateral;   INSERTION OF MESH Right 12/29/2014   Procedure: INSERTION OF MESH;  Surgeon: Axel Filler, MD;  Location: [facility];  Service: General;  Laterality: Right;   LAPAROSCOPY N/A 12/29/2014   Procedure: LAPAROSCOPY DIAGNOSTIC;  Surgeon: Axel Filler, MD;  Location: [facility];  Service: General;  Laterality: N/A;   LITHOTRIPSY     x3   LIVER BIOPSY N/A 12/29/2014   Procedure: LIVER BIOPSY;  Surgeon: Axel Filler, MD;  Location: [facility];  Service: General;  Laterality: N/A;   SHOULDER ARTHROSCOPY WITH BICEPS TENDON REPAIR  01/02/2021   Procedure: BICEPS TENDON REPAIR;  Surgeon: Salvatore Marvel, MD;  Location: [facility];  Service: Orthopedics;;   SHOULDER ARTHROSCOPY WITH ROTATOR CUFF REPAIR AND SUBACROMIAL DECOMPRESSION Right 01/02/2021   Procedure: SHOULDER ARTHROSCOPY WITH ROTATOR CUFF REPAIR AND SUBACROMIAL DECOMPRESSION, DISTAL CLAVICLE EXCISION;  Surgeon: Salvatore Marvel, MD;  Location: [facility];  Service: Orthopedics;  Laterality: Right;   TONSILLECTOMY     VASECTOMY     Patient Active Problem List   Diagnosis Date Noted   Other erythrocytosis 10/08/2021   Traumatic rotator cuff tear, right, subsequent encounter 12/13/2020   Impingement syndrome of right shoulder 12/13/2020   Acromioclavicular joint arthritis 12/13/2020   Erythrocytosis 11/18/2018   Non-restorative sleep 11/18/2018   Coronary artery disease involving native coronary artery of native heart with angina pectoris with documented spasm (HCC) 11/18/2018   Status post angioplasty with stent 11/18/2018   Excessive daytime sleepiness 11/18/2018   Snoring 11/18/2018   Hip pain, acute, unspecified laterality 11/18/2018   Diverticulitis of colon 05/18/2014    PCP: Chilton Greathouse MD  REFERRING PROVIDER: Windle Guard, MD  REFERRING DIAG: M54.50 (ICD-10-CM) - Low back pain  Rationale for Evaluation and Treatment: Rehabilitation  THERAPY DIAG:  Muscle weakness (generalized)  Lumbar back pain  ONSET DATE: Multiple years  SUBJECTIVE:                                                                                                                                                                                           SUBJECTIVE STATEMENT: Not having any pain today. Standing for long periods, most of the day, he'll notice 3-4/10 discomfort. Sitting to rest helps calm down symptoms. He  worked out at [employer] this morning and walked two miles after. Patient has been doing his HEP and warming up on the bike prior to his personal training sessions. Notices some tailbone pain when sitting but not as much as he used to have.  Goes by "Vincent Mendoza"  Eval:Chronic problem over past few years. Notices standing for long times will increase pain. 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 Pain in coccyx area with prolonged sitting irritates it and feels like he has had fall on bottom but no fall. Getting ready to start on Personal trainer on Monday. 3.5 mile walk this morning. Prolonged rest in one position also increased pain. Side sleeper with at least 6-8 hours of sleep. Reports increased urgency in urination in recent time.  PERTINENT HISTORY:  Morton's Neuroma Stent Diuretics  PAIN:  Are you having pain? Yes: NPRS scale: 2/10 Pain location: low back Pain description: achy Aggravating factors: long term work Relieving factors: resting   PRECAUTIONS: None  RED FLAGS: None   WEIGHT BEARING RESTRICTIONS: No  FALLS:  Has patient fallen in last 6 months? No   OCCUPATION: Retired vertinarian   PLOF: Independent  PATIENT GOALS: control pain  NEXT MD VISIT: no followup yet  OBJECTIVE:  Note: Objective measures were completed at Evaluation unless otherwise noted.  DIAGNOSTIC FINDINGS:  X-ray lumbar spine (10/18/2022): 5 lumbar-type vertebral bodies without significant scoliosis. No high-grade listhesis. Disc space heights well-maintained. Facet arthrosis at L4-5 and L5-S1. Mildly exaggerated lumbar lordosis. No significant displacement of the coccyx.   PATIENT SURVEYS:  Modified Oswestry 9/50   COGNITION: Overall cognitive status: Within functional limits for tasks assessed     SENSATION: WFL  MUSCLE LENGTH: Hamstrings: Right 65 deg; Left 55 deg and painful Thomas test: next sesion  POSTURE: rounded shoulders,  forward head, increased lumbar lordosis, and anterior pelvic tilt  PALPATION: TTP along lumbar paraspinals and increased tension note through paraspinals  LUMBAR ROM:   AROM eval  Flexion 50%  Extension 75% and painful  Right lateral flexion 75% and painful  Left  lateral flexion 75% and painful  Right rotation 75% and painful  Left rotation 75% and painful   (Blank rows = not tested)  LOWER EXTREMITY ROM:     Passive  Right eval Left eval  Hip flexion    Hip extension    Hip abduction    Hip adduction    Hip internal rotation 18 15  Hip external rotation [facility]  Knee flexion    Knee extension    Ankle dorsiflexion    Ankle plantarflexion    Ankle inversion    Ankle eversion     (Blank rows = not tested)  LOWER EXTREMITY MMT:    MMT Right eval Left eval  Hip flexion 4- 4-  Hip extension 3+ 3+  Hip abduction 4- 4-  Hip adduction    Hip internal rotation    Hip external rotation    Knee flexion    Knee extension    Ankle dorsiflexion    Ankle plantarflexion    Ankle inversion    Ankle eversion     (Blank rows = not tested)  LUMBAR SPECIAL TESTS:  Seated extension rotation test Marden Noble test): Positive  GAIT: Distance walked: 46ft Assistive device utilized: None Level of assistance: Complete Independence Comments: mild bilateral trendelenberg with anterior pelvic tilt  TODAY'S TREATMENT:                                                                                                                              DATE:  01/10/23 Warm up on recumbent bike 6' Pelvic tilt x 5  Bridge x 10 Bridge with feet on red physio ball   01/01/23 Warm up on [employer] bike 5' Abdominal bracing synced with breathing (tight core following exhale) 5x5" hold Abdominal bracing with bridge synced with breathing 10x 3" hold Abdominal bracing with ball squeeze and bridge 10x 3" hold Abdominal bracing with GTB Clamshell and bridge 10x 3" hold Dead Bug with GTB pull apart UE  and Leg Ext x10 Bilateral    12/17/22 Supine:  Hamstring stretch 5 x 20" Abdominal bracing 5" hold x 10 Abdominal bracing with bridge x 10 Abdominal bracing with ball squeeze and bridge x 10 Abdominal bracing with Clamshell GTB x 10  Dead Bug with GTB pull apart UE and Leg Ext x10 Bilateral    12/09/2022 Review HEP and goals Seated hamstring stretch 5 x 20" Physioball abdominal bracing 5" hold x 10  Supine: Posterior pelvic tilt 5" hold x 10 Abdominal bracing 5" x 10 Abdominal bracing with bridge x 10  Sidelying  Clam RTB x 10 each   12/06/2022 PT evaluation, findings, frequency, prognosis    PATIENT EDUCATION:  Education details: Findings, prognosis, discussion of relation between back pain and pelvic floor pain. Discussed coccyx pain as well. P Person educated: Patient Education method: Explanation, Demonstration, Tactile cues, Verbal cues, and Handouts Education comprehension: verbalized understanding and returned demonstration  HOME EXERCISE PROGRAM: Access Code: [email or web address] URL:  [email or web address] Date: 01/01/2023 Prepared by: [facility] - Rehab  Exercises - Supine Posterior Pelvic Tilt  - 1 x daily - 7 x weekly - 3 sets - 10 reps - Seated Hamstring Stretch  - 1 x daily - 7 x weekly - 3 sets - 10 reps - Hooklying Clamshell with Resistance  - 2 x daily - 7 x weekly - 1 sets - 10 reps - Supine Transversus Abdominis Bracing - Hands on Stomach  - 1 x daily - 7 x weekly - 1 sets - 10 reps - 5 sec hold - Supine Bridge  - 1 x daily - 7 x weekly - 1 sets - 10 reps - 5 sec hold - Supine Bridge with Mini Swiss Ball Between Knees  - 1 x daily - 7 x weekly - 1 sets - 10 reps - Supine Dead Bug with Leg Extension and Band Brace  - 1 x daily - 7 x weekly - 1 sets - 10 reps - Alternating Single Leg Bridge  - 1 x daily - 7 x weekly - 1 sets - 10 reps  ASSESSMENT:  CLINICAL IMPRESSION: Patient presents to the clinic following a morning exercise session. He is  motivated to improve his physical activity level. Now that the patient has been consistently going to the gym and being active daily, his tolerance to activity has improved quite a bit from baseline. Today we introduced exercises on a physio ball adding a bridge, knee to chest roll outs, and a hamstring curl all with the ball.  Patient needed cues for proper form and technique as well as to keep breathing throughout exercise, but was able to demonstrate activity without verbal cues. Updated HEP with new exercises from today's session. Patient would like to discuss how to merge his personal training exercises and his PT exercises, so he doesn't have to spend extra time every day doing both. Patient will benefit from continued skilled therapy services to address deficits and promote return to optimal function.      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 Pt lives very active lifestyle but is limited in standing and prolonged activities in sitting and standing. Reports coccyx pain with prolonged sitting. Along with x-ray findings and Facet Hypertrophy, pt showing muscle imbalance in similar pattern of lower cross syndrome. Pt demonstrating limitations in functional endurance during ADLs, home activities requiring lifting, cleaning, and recreational due to low back pain. These limitations are evident by ROM restrictions, muscle weakness, poor postural awareness, and pain. Pt would benefit from skilled Physical Therapy services to address functional deficits and improve overall QOL.  OBJECTIVE IMPAIRMENTS: decreased activity tolerance, decreased mobility, decreased ROM, decreased strength, hypomobility, and pain.   ACTIVITY LIMITATIONS: carrying, lifting, bending, sitting, standing, and sleeping  PARTICIPATION LIMITATIONS: driving, shopping, community activity, occupation, and yard work  PERSONAL FACTORS: Age are  also affecting patient's functional outcome.   REHAB POTENTIAL: Excellent  CLINICAL DECISION MAKING: Stable/uncomplicated  EVALUATION COMPLEXITY: Low   GOALS: Goals reviewed with patient? No  SHORT TERM GOALS: Target date: 01/03/2023  Pt will be independent with HEP in order to demonstrate participation in Physical Therapy POC.  Baseline: Goal status: in progress 2.  Pt will report 'No pain' while performing functional recreational activities for at least 30 minutes in order to improve endurance capacity  Baseline:  Goal status: in progress  LONG TERM GOALS: Target date: 01/31/2023  Pt will  improve hip extension strength by > 1/2 grade in order to improve functional strength during recreational activities.  Baseline: see objective  Goal status:in progress  2.  Pt will will improve hip abduction strength by > 1/2 grade in order to improve functional strength during recreational activities.  Baseline: see objective  Goal status: in progress   3.  Pt will improve Modified Oswestry score by 5 points in order to demonstrate improved pain with functional goals and outcomes. Baseline: 9/50 Goal status: in progress  4.  Pt will report 0/10 pain while performing functional recreational activities for > 60 minutes in order to improve endurance capacity  Baseline: see objective  Goal status: in progress   5. Pt will improve hamstring extensibility by > 10 degrees in order to improve muscle mobility and improve performance during recreational events Baseline: see objective  Goal Status: in progress   PLAN:  PT FREQUENCY: 1x/week  PT DURATION: 8 weeks  PLANNED INTERVENTIONS: 97146- PT Re-evaluation, 97110-Therapeutic exercises, 97530- Therapeutic activity, 97112- Neuromuscular re-education, 97535- Self Care, [id number]- Manual therapy, [id number]- Gait training, [phone]- Traction (mechanical), Patient/Family education, Dry Needling, Joint mobilization, Joint manipulation, and Spinal  mobilization.  PLAN FOR NEXT SESSION: Follow up in regards to new HEP exercises on physio ball. Progress core exercises in a bridge variation. Discuss how to merge HEP and personal training session.   12:37 PM, 01/10/23 Karna Christmas, SPT  " I agree with the following treatment note after reviewing documentation. This session was performed under the supervision of a licensed clinician."    9:22 AM, 01/11/23 Amy Small Lynch MPT [place] [facility] [place] [phone]

## 2023-01-13 ENCOUNTER — Encounter: Payer: Self-pay | Admitting: Internal Medicine

## 2023-01-13 ENCOUNTER — Ambulatory Visit: Payer: Medicare Other | Attending: Internal Medicine | Admitting: Internal Medicine

## 2023-01-13 VITALS — BP 112/74 | HR 60 | Ht 70.0 in | Wt 190.6 lb

## 2023-01-13 DIAGNOSIS — I2510 Atherosclerotic heart disease of native coronary artery without angina pectoris: Secondary | ICD-10-CM | POA: Diagnosis present

## 2023-01-13 DIAGNOSIS — Z136 Encounter for screening for cardiovascular disorders: Secondary | ICD-10-CM

## 2023-01-13 DIAGNOSIS — E782 Mixed hyperlipidemia: Secondary | ICD-10-CM

## 2023-01-13 DIAGNOSIS — I10 Essential (primary) hypertension: Secondary | ICD-10-CM

## 2023-01-13 DIAGNOSIS — Z9861 Coronary angioplasty status: Secondary | ICD-10-CM | POA: Insufficient documentation

## 2023-01-13 NOTE — Patient Instructions (Signed)
Medication Instructions:  No changes. *If you need a refill on your cardiac medications before your next appointment, please call your pharmacy*   Lab Work: FASTING NMR and Lpa at your earliest convenience If you have labs (blood work) drawn today and your tests are completely normal, you will receive your results only by: MyChart Message (if you have MyChart) OR A paper copy in the mail If you have any lab test that is abnormal or we need to change your treatment, we will call you to review the results.    Follow-Up: At [facility], you and your health needs are our priority.  As part of our continuing mission to provide you with exceptional heart care, we have created designated Provider Care Teams.  These Care Teams include your primary Cardiologist (physician) and Advanced Practice Providers (APPs -  Physician Assistants and Nurse Practitioners) who all work together to provide you with the care you need, when you need it.  We recommend signing up for the patient portal called "MyChart".  Sign up information is provided on this After Visit Summary.  MyChart is used to connect with patients for Virtual Visits (Telemedicine).  Patients are able to view lab/test results, encounter notes, upcoming appointments, etc.  Non-urgent messages can be sent to your provider as well.   To learn more about what you can do with MyChart, go to [email or web address].    Your next appointment:   12 month(s)  Provider:   Chrystie Nose, MD

## 2023-01-13 NOTE — Progress Notes (Unsigned)
OFFICE CONSULT NOTE  Chief Complaint:  Follow-up  Primary Care Physician: Chilton Greathouse, MD  HPI:  Vincent Mendoza is a 67 y.o. male who is being seen today for the evaluation of recent NSTEMI at the request of Avva, Ravisankar, MD. 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  According to his records, he presented at [facility] after awakening from his dead sleep at 3 AM.  He had left-sided shoulder pain radiating anteriorly of the mid chest and neck.  He said it lasted for more than 10 minutes was noted to be about a 4 out of 10 chest pain.  He presented to the emergency department and initial troponin was elevated 0.1.  Subsequent troponins were 0.35 and 0.63.  He was diagnosed with a non-STEMI and treated appropriately.  He did undergo left heart catheterization on 04/20/2018 at [facility].  This demonstrated an 80% proximal LAD stenosis and a 40% distal LAD stenosis.  There was a 50% mid RCA lesion as well.  Subsequently he underwent balloon angioplasty and placement of a 2.5 x 18 mm Onyx resolute drug-eluting stent.  This was postdilated with a 3.0 x 15 mm Quantum apex balloon.  TIMI-3 flow was noted.  He had an echocardiogram as well which showed normal systolic function, mild LVH, very mild hypokinesis of the basal anterior mid anterior and apical anterior walls.  Grade 1 diastolic dysfunction was noted with mild aortic insufficiency.  Since stenting he is done well.  He is got some occasional sharp chest discomfort but no further anginal symptoms.  He has started walking more regularly and says he is committed to changing his dietary habits.  Although he has a history of dyslipidemia he had been on high potency statin therapy with Crestor 20 mg daily.  During a recent physical with his PCP, his labs indicated total cholesterol 158, HDL 50, LDL 79 and triglycerides 147.  He had repeat labs during his hospitalization which showed total  cholesterol of 130, triglycerides 197, HDL 42 and LDL of 49.  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  Overall he says he is doing well.  He is doing home rehabilitation.  Occasionally gets some pain in his left shoulder area which is reminiscent of his anginal symptoms when he starts exercising however does dissipate with more exercise.  He denies any worsening shortness of breath in fact his exercise is getting a little easier.  Recently though he has not been exercising as much due to stress at work.  He hopes to get back to that.  He was successfully started on Vascepa.  We have not yet had repeat lipids.  He plans to see his primary care provider in December will have lab work at that time which I will review.  Most likely we could consider coming off of dual antiplatelet therapy next February.  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 Overall he is doing very well. He denies chest pain or shortness of breath. He is 1 year post-PCI. At this point, I feel we can discontinue his Plavix. His only real complaint is ED - he wonders if the metoprolol may be contributing to that. It is possible, but in my experience of using it frequently over many years, it is less likely.  He did bring this up to his urologist who had prescribed him Viagra but he has not used it consistently.  I feel great importance of  using beta-blockers in patients with non-STEMI and coronary artery disease given multiple cardiovascular benefits.  He was advised not to use nitrates and Viagra concomitantly.  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  He has no significant complaints.  He gets occasional chest discomfort but is not necessarily with exertion or relieved by rest.  He has not used nitroglycerin and his prescriptions now 67 years old.  His blood pressure is excellent today at 122/74.  Labs in January showed a marked improvement in his cholesterol with total 129, HDL 44, LDL 61 and triglycerides  191.  EKG shows a sinus bradycardia at 58 without ischemia.  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  No symptoms of angina or worsening shortness of breath.  Had ongoing lab work through his primary care provider, last in February 2023 which showed total cholesterol 129, triglycerides 112, HDL 36 and LDL 71.  His main concerns today however related to fatigue.  This has been a longstanding issue, in fact he underwent a sleep study for further evaluation of this back in 2020.  This was interpreted at [facility] neurologic as mild sleep apnea.  He was supposed to have an in lab titration but never followed up with that and is not wearing his CPAP.  In addition he struggled with erectile dysfunction.  He has had some success with sildenafil however reports it does not always work and he has issues with headaches.  He has not had assessment of his testosterone to my knowledge.  He also has numerous concerns about tickborne illness because of his tick bites and exposure as a International aid/development worker.  He also has tick exposure on his property.  He had requested evaluation for unusual tickborne illnesses.  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  Overall he is done pretty well.  Blood pressure is well controlled.  He denies any chest pain or shortness of breath.  He is due for lipid profile which we will order.  EKG today shows a sinus bradycardia.  He did not have much success with sildenafil regarding erectile dysfunction.  I encouraged him to reach out to his urologist for other options.  We had trialed a short period of time off of statin to see if he had some improvement in his symptoms and he noted he did but he has subsequently restarted it at a lower dose.  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  Overall he says he is doing well.  He is exercising more regularly now with a personal trainer and has made dietary changes.  His weight is coming down and his blood pressure is  well-controlled.  Unfortunately his father passed away recently.  He has stopped the Vascepa.  He has not had repeat lipids since October of last year however his LDL was well-controlled at 95.  He denies any chest pain or worsening shortness of breath.  PMHx:  Past Medical History:  Diagnosis Date   Acromioclavicular joint arthritis 12/13/2020   Diverticulosis    Gout    Hyperlipidemia    Hypertension    stopped taking Lisinopril after a week   Impingement syndrome of right shoulder 12/13/2020   Inguinal hernia    right   Kidney stone    Morton's neuroma of left foot    Myocardial infarction [facility]) 2020   with stent   Sleep apnea    Wears glasses     Past Surgical History:  Procedure Laterality Date   cardiac  stent placement     COLONOSCOPY  2018   EXCISION MORTON'S NEUROMA Left 03/20/2017   Procedure: Left 3-4 Morton's Neuroma Excision;  Surgeon: Toni Arthurs, MD;  Location: [facility];  Service: Orthopedics;  Laterality: Left;   INGUINAL HERNIA REPAIR Bilateral 12/29/2014   Procedure: LAPAROSCOPIC BILATERAL INGUINAL HERNIA REPAIR;  Surgeon: Axel Filler, MD;  Location: [facility] OR;  Service: General;  Laterality: Bilateral;   INSERTION OF MESH Right 12/29/2014   Procedure: INSERTION OF MESH;  Surgeon: Axel Filler, MD;  Location: [facility] OR;  Service: General;  Laterality: Right;   LAPAROSCOPY N/A 12/29/2014   Procedure: LAPAROSCOPY DIAGNOSTIC;  Surgeon: Axel Filler, MD;  Location: [facility] OR;  Service: General;  Laterality: N/A;   LITHOTRIPSY     x3   LIVER BIOPSY N/A 12/29/2014   Procedure: LIVER BIOPSY;  Surgeon: Axel Filler, MD;  Location: [facility] OR;  Service: General;  Laterality: N/A;   SHOULDER ARTHROSCOPY WITH BICEPS TENDON REPAIR  01/02/2021   Procedure: BICEPS TENDON REPAIR;  Surgeon: Salvatore Marvel, MD;  Location: [facility];  Service: Orthopedics;;   SHOULDER ARTHROSCOPY WITH ROTATOR CUFF REPAIR AND SUBACROMIAL DECOMPRESSION Right 01/02/2021    Procedure: SHOULDER ARTHROSCOPY WITH ROTATOR CUFF REPAIR AND SUBACROMIAL DECOMPRESSION, DISTAL CLAVICLE EXCISION;  Surgeon: Salvatore Marvel, MD;  Location: [facility];  Service: Orthopedics;  Laterality: Right;   TONSILLECTOMY     VASECTOMY      FAMHx:  Family History  Problem Relation Age of Onset   Heart disease Mother    Heart disease Father    Diabetes Mellitus II Father    Pancreatic cancer Maternal Aunt    Heart disease Maternal Grandfather    Heart disease Paternal Grandfather    Colon cancer Neg Hx    Colon polyps Neg Hx    Esophageal cancer Neg Hx    Stomach cancer Neg Hx    Rectal cancer Neg Hx     SOCHx:   reports that he has never smoked. He has never used smokeless tobacco. He reports current alcohol use of about 7.0 standard drinks of alcohol per week. He reports that he does not use drugs.  ALLERGIES:  Allergies  Allergen Reactions   Lisinopril Cough    ROS: Pertinent items noted in HPI and remainder of comprehensive ROS otherwise negative.  HOME MEDS: Current Outpatient Medications on File Prior to Visit  Medication Sig Dispense Refill   allopurinol (ZYLOPRIM) 100 MG tablet Take 100 mg by mouth daily.     aspirin EC 81 MG tablet Take 81 mg by mouth daily.     fexofenadine (ALLEGRA) 60 MG tablet Take 1 tablet (60 mg total) by mouth 2 (two) times daily. 180 tablet 3   hydrochlorothiazide (MICROZIDE) 12.5 MG capsule Take 12.5 mg by mouth daily.     losartan (COZAAR) 100 MG tablet Take 100 mg by mouth daily.     metoprolol tartrate (LOPRESSOR) 25 MG tablet Take 0.5 tablets (12.5 mg total) by mouth 2 (two) times daily. 45 tablet 3   potassium citrate (UROCIT-K) 10 MEQ (1080 MG) SR tablet Take 10 mEq by mouth daily at 6 (six) AM.     rosuvastatin (CRESTOR) 20 MG tablet Take 10 mg by mouth daily.     Semaglutide-Weight Management (WEGOVY) 1 MG/0.5ML SOAJ Inject 1 mg into the skin once a week. 2 mL 1   sildenafil (VIAGRA) 50 MG tablet TAKE 1-2  TABLETS BY MOUTH AS NEEDED FOR ERECTILE DYSFUNCTION 20 tablet 0  tadalafil (CIALIS) 5 MG tablet Take 5 mg by mouth daily.     Semaglutide-Weight Management (WEGOVY) 0.25 MG/0.5ML SOAJ Inject 0.25 mg into the skin once a week for 4 weeks, then increase to 0.5mg  2 mL 0   Semaglutide-Weight Management (WEGOVY) 0.5 MG/0.5ML SOAJ Inject 0.5 mg into the skin once a week. 2 mL 1   Semaglutide-Weight Management 0.25 MG/0.5ML SOAJ Inject 0.25 mg into the skin once a week. 2 mL 1   Semaglutide-Weight Management 0.5 MG/0.5ML SOAJ Inject 0.5 mg into the skin. 2 mL 1   VASCEPA 1 g capsule Take 2 capsules (2 g total) by mouth 2 (two) times daily. (Patient not taking: Reported on 01/13/2023) 120 capsule 0   No current facility-administered medications on file prior to visit.    LABS/IMAGING: No results found for this or any previous visit (from the past 48 hour(s)). No results found.  LIPID PANEL:    Component Value Date/Time   CHOL 121 12/13/2021 0818   TRIG 96 12/13/2021 0818   HDL 37 (L) 12/13/2021 0818   CHOLHDL 3.3 12/13/2021 0818   VLDL 19 12/13/2021 0818   LDLCALC 65 12/13/2021 0818    WEIGHTS: Wt Readings from Last 3 Encounters:  01/13/23 190 lb 9.6 oz (86.5 kg)  12/05/21 194 lb (88 kg)  10/08/21 194 lb (88 kg)    VITALS: BP 112/74 (BP Location: Left Arm, Patient Position: Sitting, Cuff Size: Normal)   Pulse 60   Ht 5\' 10"  (1.778 m)   Wt 190 lb 9.6 oz (86.5 kg)   SpO2 97%   BMI 27.35 kg/m   EXAM: General appearance: alert and no distress Neck: no carotid bruit, no JVD and thyroid not enlarged, symmetric, no tenderness/mass/nodules Lungs: clear to auscultation bilaterally Heart: regular rate and rhythm, S1, S2 normal and diastolic murmur: early diastolic 2/6, blowing at 2nd right intercostal space Abdomen: soft, non-tender; bowel sounds normal; no masses,  no organomegaly Extremities: extremities normal, atraumatic, no cyanosis or edema Pulses: 2+ and symmetric Skin: Skin  color, texture, turgor normal. No rashes or lesions Neurologic: Grossly normal Psych: Pleasant  EKG: EKG Interpretation Date/Time:  Monday January 13 2023 14:06:32 EST Ventricular Rate:  60 PR Interval:  180 QRS Duration:  92 QT Interval:  406 QTC Calculation: 406 R Axis:   5  Text Interpretation: Normal sinus rhythm Inferior infarct (cited on or before 16-Oct-2011) Cannot rule out Anterior infarct , age undetermined When compared with ECG of 20-Mar-2017 06:42, No significant change was found Confirmed by Zoila Shutter [phone]) on 01/13/2023 2:13:12 PM    ASSESSMENT: NSTEMI CAD status post DES to the proximal LAD with residual 50% mid RCA disease and distal LAD disease (04/20/2018) Dyslipidemia Hypertension Family history of coronary disease in both parents Fatigue Mild OSA-not on CPAP Erectile dysfunction   PLAN: 1.   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  He will need reassessment of his lipids and I advised ordering an NMR lipid profile and LP(a).  Further medication adjustment as necessary.  Otherwise blood pressure is well-controlled.  He denies any chest pain.  No symptoms with exertion.  Plan follow-up with me annually or sooner as necessary.  Chrystie Nose, MD, [facility], FACP  [place]  [facility]  Medical Director of the [facility] Lipid Disorders &  Cardiovascular Risk Reduction Clinic Diplomate of the American Board of Clinical Lipidology Attending Cardiologist  Direct Dial: [phone]  Fax: [phone]  Website:  [email or web address] Nicely Jax Abdelrahman 01/13/2023, 2:13 PM

## 2023-01-14 ENCOUNTER — Ambulatory Visit (HOSPITAL_COMMUNITY): Payer: Medicare Other

## 2023-01-14 DIAGNOSIS — M6281 Muscle weakness (generalized): Secondary | ICD-10-CM

## 2023-01-14 DIAGNOSIS — M545 Low back pain, unspecified: Secondary | ICD-10-CM | POA: Diagnosis not present

## 2023-01-14 NOTE — Therapy (Addendum)
OUTPATIENT PHYSICAL THERAPY THORACOLUMBAR TREATMENT   Patient Name: Vincent Mendoza MRN: [id number] DOB:31-Jan-1956, 67 y.o., male Today's Date: 01/14/2023  END OF SESSION:  PT End of Session - 01/14/23 0759     Visit Number 6    Number of Visits 8    Date for PT Re-Evaluation 01/31/23    Authorization Type Medicare part A & B; BCBS supplement secondary    Authorization Time Period no auth; no limit    Progress Note Due on Visit 8    PT Start Time 0800    PT Stop Time 0845    PT Time Calculation (min) 45 min    Behavior During Therapy [facility] for tasks assessed/performed              Past Medical History:  Diagnosis Date   Acromioclavicular joint arthritis 12/13/2020   Diverticulosis    Gout    Hyperlipidemia    Hypertension    stopped taking Lisinopril after a week   Impingement syndrome of right shoulder 12/13/2020   Inguinal hernia    right   Kidney stone    Morton's neuroma of left foot    Myocardial infarction [facility]) 2020   with stent   Sleep apnea    Wears glasses    Past Surgical History:  Procedure Laterality Date   cardiac stent placement     COLONOSCOPY  2018   EXCISION MORTON'S NEUROMA Left 03/20/2017   Procedure: Left 3-4 Morton's Neuroma Excision;  Surgeon: Vincent Arthurs, MD;  Location: [facility];  Service: Orthopedics;  Laterality: Left;   INGUINAL HERNIA REPAIR Bilateral 12/29/2014   Procedure: LAPAROSCOPIC BILATERAL INGUINAL HERNIA REPAIR;  Surgeon: Vincent Filler, MD;  Location: [facility];  Service: General;  Laterality: Bilateral;   INSERTION OF MESH Right 12/29/2014   Procedure: INSERTION OF MESH;  Surgeon: Vincent Filler, MD;  Location: [facility];  Service: General;  Laterality: Right;   LAPAROSCOPY N/A 12/29/2014   Procedure: LAPAROSCOPY DIAGNOSTIC;  Surgeon: Vincent Filler, MD;  Location: [facility];  Service: General;  Laterality: N/A;   LITHOTRIPSY     x3   LIVER BIOPSY N/A 12/29/2014   Procedure: LIVER BIOPSY;  Surgeon: Vincent Filler, MD;  Location: [facility];  Service: General;  Laterality: N/A;   SHOULDER ARTHROSCOPY WITH BICEPS TENDON REPAIR  01/02/2021   Procedure: BICEPS TENDON REPAIR;  Surgeon: Vincent Marvel, MD;  Location: [facility];  Service: Orthopedics;;   SHOULDER ARTHROSCOPY WITH ROTATOR CUFF REPAIR AND SUBACROMIAL DECOMPRESSION Right 01/02/2021   Procedure: SHOULDER ARTHROSCOPY WITH ROTATOR CUFF REPAIR AND SUBACROMIAL DECOMPRESSION, DISTAL CLAVICLE EXCISION;  Surgeon: Vincent Marvel, MD;  Location: [facility];  Service: Orthopedics;  Laterality: Right;   TONSILLECTOMY     VASECTOMY     Patient Active Problem List   Diagnosis Date Noted   Other erythrocytosis 10/08/2021   Traumatic rotator cuff tear, right, subsequent encounter 12/13/2020   Impingement syndrome of right shoulder 12/13/2020   Acromioclavicular joint arthritis 12/13/2020   Erythrocytosis 11/18/2018   Non-restorative sleep 11/18/2018   Coronary artery disease involving native coronary artery of native heart with angina pectoris with documented spasm (HCC) 11/18/2018   Status post angioplasty with stent 11/18/2018   Excessive daytime sleepiness 11/18/2018   Snoring 11/18/2018   Hip pain, acute, unspecified laterality 11/18/2018   Diverticulitis of colon 05/18/2014    PCP: Vincent Greathouse MD  REFERRING PROVIDER: Windle Guard, MD  REFERRING DIAG: M54.50 (ICD-10-CM) - Low back pain  Rationale for Evaluation and Treatment: Rehabilitation  THERAPY DIAG:  Lumbar back pain  Muscle weakness (generalized)  ONSET DATE: Multiple years  SUBJECTIVE:                                                                                                                                                                                           SUBJECTIVE STATEMENT: Patient reports having gone to the gym prior to arriving to PT this morning. The workout was a "back day". He was able to increase weights by 5lbs on  almost every exercise. He feels he's coming along well. Some slight pain and fatigue at the end of the day over the weekend, but nothing too much.  Goes by "Vincent Mendoza"  Eval:Chronic problem over past few years. Notices standing for long times will increase pain. 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 Pain in coccyx area with prolonged sitting irritates it and feels like he has had fall on bottom but no fall. Getting ready to start on Personal trainer on Monday. 3.5 mile walk this morning. Prolonged rest in one position also increased pain. Side sleeper with at least 6-8 hours of sleep. Reports increased urgency in urination in recent time.  PERTINENT HISTORY:  Morton's Neuroma Stent Diuretics  PAIN:  Are you having pain? Yes: NPRS scale: 2/10 Pain location: low back Pain description: achy Aggravating factors: long term work Relieving factors: resting   PRECAUTIONS: None  RED FLAGS: None   WEIGHT BEARING RESTRICTIONS: No  FALLS:  Has patient fallen in last 6 months? No   OCCUPATION: Retired vertinarian   PLOF: Independent  PATIENT GOALS: control pain  NEXT MD VISIT: no followup yet  OBJECTIVE:  Note: Objective measures were completed at Evaluation unless otherwise noted.  DIAGNOSTIC FINDINGS:  X-ray lumbar spine (10/18/2022): 5 lumbar-type vertebral bodies without significant scoliosis. No high-grade listhesis. Disc space heights well-maintained. Facet arthrosis at L4-5 and L5-S1. Mildly exaggerated lumbar lordosis. No significant displacement of the coccyx.   PATIENT SURVEYS:  Modified Oswestry 9/50   COGNITION: Overall cognitive status: Within functional limits for tasks assessed     SENSATION: WFL  MUSCLE LENGTH: Hamstrings: Right 65 deg; Left 55 deg and painful Thomas test: next sesion  POSTURE: rounded shoulders, forward head, increased lumbar lordosis, and anterior pelvic tilt  PALPATION: TTP along lumbar  paraspinals and increased tension note through paraspinals  LUMBAR ROM:   AROM eval  Flexion 50%  Extension 75% and painful  Right lateral flexion 75% and painful  Left lateral flexion 75% and painful  Right rotation 75% and painful  Left rotation  75% and painful   (Blank rows = not tested)  LOWER EXTREMITY ROM:     Passive  Right eval Left eval  Hip flexion    Hip extension    Hip abduction    Hip adduction    Hip internal rotation 18 15  Hip external rotation [facility]  Knee flexion    Knee extension    Ankle dorsiflexion    Ankle plantarflexion    Ankle inversion    Ankle eversion     (Blank rows = not tested)  LOWER EXTREMITY MMT:    MMT Right eval Left eval  Hip flexion 4- 4-  Hip extension 3+ 3+  Hip abduction 4- 4-  Hip adduction    Hip internal rotation    Hip external rotation    Knee flexion    Knee extension    Ankle dorsiflexion    Ankle plantarflexion    Ankle inversion    Ankle eversion     (Blank rows = not tested)  LUMBAR SPECIAL TESTS:  Seated extension rotation test Marden Noble test): Positive  GAIT: Distance walked: 84ft Assistive device utilized: None Level of assistance: Complete Independence Comments: mild bilateral trendelenberg with anterior pelvic tilt  TODAY'S TREATMENT:                                                                                                                              DATE:  01/14/23 Warm up on recumbent bike 6' Pelvic tilt x 5  Bridge x 10 with 3 sec hold Supine on red physio ball: Knee to chest on x10 Bridge with feet on ball 2x10, 3 sec hold Hamstring curl 2x10  Single leg hamstring curl x 10 each Shoulder extension BTB x 10 Hip Hinge with BTB shoulder extension for RNT cue   01/10/23 Warm up on recumbent bike 6' Pelvic tilt x 5  Bridge x 10 Supine on red physio ball: Knee to chest on x10 Bridge with feet on ball x10 Hamstring curl x10  Single leg hamstring curl x 10  each   01/01/23 Warm up on [employer] bike 5' Abdominal bracing synced with breathing (tight core following exhale) 5x5" hold Abdominal bracing with bridge synced with breathing 10x 3" hold Abdominal bracing with ball squeeze and bridge 10x 3" hold Abdominal bracing with GTB Clamshell and bridge 10x 3" hold Dead Bug with GTB pull apart UE and Leg Ext x10 Bilateral    12/17/22 Supine:  Hamstring stretch 5 x 20" Abdominal bracing 5" hold x 10 Abdominal bracing with bridge x 10 Abdominal bracing with ball squeeze and bridge x 10 Abdominal bracing with Clamshell GTB x 10  Dead Bug with GTB pull apart UE and Leg Ext x10 Bilateral    12/09/2022 Review HEP and goals Seated hamstring stretch 5 x 20" Physioball abdominal bracing 5" hold x 10  Supine: Posterior pelvic tilt 5" hold x 10 Abdominal bracing 5" x 10 Abdominal bracing with bridge x  10  Sidelying  Clam RTB x 10 each   12/06/2022 PT evaluation, findings, frequency, prognosis    PATIENT EDUCATION:  Education details: Findings, prognosis, discussion of relation between back pain and pelvic floor pain. Discussed coccyx pain as well. P Person educated: Patient Education method: Explanation, Demonstration, Tactile cues, Verbal cues, and Handouts Education comprehension: verbalized understanding and returned demonstration  HOME EXERCISE PROGRAM: Access Code: [email or web address] URL: [email or web address] Date: 01/01/2023 Prepared by: [facility] - Rehab  Exercises - Supine Posterior Pelvic Tilt  - 1 x daily - 7 x weekly - 3 sets - 10 reps - Seated Hamstring Stretch  - 1 x daily - 7 x weekly - 3 sets - 10 reps - Hooklying Clamshell with Resistance  - 2 x daily - 7 x weekly - 1 sets - 10 reps - Supine Transversus Abdominis Bracing - Hands on Stomach  - 1 x daily - 7 x weekly - 1 sets - 10 reps - 5 sec hold - Supine Bridge  - 1 x daily - 7 x weekly - 1 sets - 10 reps - 5 sec hold - Supine Bridge with Mini Swiss Ball Between  Knees  - 1 x daily - 7 x weekly - 1 sets - 10 reps - Supine Dead Bug with Leg Extension and Band Brace  - 1 x daily - 7 x weekly - 1 sets - 10 reps - Alternating Single Leg Bridge  - 1 x daily - 7 x weekly - 1 sets - 10 reps  ASSESSMENT:  CLINICAL IMPRESSION: Patient presents to the clinic following a morning exercise session. Today we reviewed exercises on a physio ball.  Patient needed less cues for proper form and technique compare to last session showing good carry over. When discussing his morning workout, PT helped reorganize exercise order and progression to decrease risk of injury and promote optimal performance. Moved full body compound movements to earlier in his exercise session. He mentioned that hex bar dead lifts seemed to stress his back; worried that his form wasn't ideal. His trainer is remote and isn't present to correct his form. PT discussed appropriate warm up and cues for proper form. Added shoulder extensions followed by a hip hinge while maintaining shoulder extension for reactive neuromuscular training and postural control. Patient's form improved with cueing; still needs some improvement. Patient will benefit from continued skilled therapy services to address deficits and promote return to optimal function.      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 Pt lives very active lifestyle but is limited in standing and prolonged activities in sitting and standing. Reports coccyx pain with prolonged sitting. Along with x-ray findings and Facet Hypertrophy, pt showing muscle imbalance in similar pattern of lower cross syndrome. Pt demonstrating limitations in functional endurance during ADLs, home activities requiring lifting, cleaning, and recreational due to low back pain. These limitations are evident by ROM restrictions, muscle weakness, poor postural awareness, and pain. Pt would benefit  from skilled Physical Therapy services to address functional deficits and improve overall QOL.  OBJECTIVE IMPAIRMENTS: decreased activity tolerance, decreased mobility, decreased ROM, decreased strength, hypomobility, and pain.   ACTIVITY LIMITATIONS: carrying, lifting, bending, sitting, standing, and sleeping  PARTICIPATION LIMITATIONS: driving, shopping, community activity, occupation, and yard work  PERSONAL FACTORS: Age are also affecting patient's functional outcome.   REHAB POTENTIAL: Excellent  CLINICAL DECISION MAKING: Stable/uncomplicated  EVALUATION COMPLEXITY: Low  GOALS: Goals reviewed with patient? No  SHORT TERM GOALS: Target date: 01/03/2023  Pt will be independent with HEP in order to demonstrate participation in Physical Therapy POC.  Baseline: Goal status: in progress 2.  Pt will report 'No pain' while performing functional recreational activities for at least 30 minutes in order to improve endurance capacity  Baseline:  Goal status: in progress  LONG TERM GOALS: Target date: 01/31/2023  Pt will improve hip extension strength by > 1/2 grade in order to improve functional strength during recreational activities.  Baseline: see objective  Goal status:in progress  2.  Pt will will improve hip abduction strength by > 1/2 grade in order to improve functional strength during recreational activities.  Baseline: see objective  Goal status: in progress   3.  Pt will improve Modified Oswestry score by 5 points in order to demonstrate improved pain with functional goals and outcomes. Baseline: 9/50 Goal status: in progress  4.  Pt will report 0/10 pain while performing functional recreational activities for > 60 minutes in order to improve endurance capacity  Baseline: see objective  Goal status: in progress   5. Pt will improve hamstring extensibility by > 10 degrees in order to improve muscle mobility and improve performance during recreational events Baseline:  see objective  Goal Status: in progress   PLAN:  PT FREQUENCY: 1x/week  PT DURATION: 8 weeks  PLANNED INTERVENTIONS: 97146- PT Re-evaluation, 97110-Therapeutic exercises, 97530- Therapeutic activity, 97112- Neuromuscular re-education, 97535- Self Care, 16109- Manual therapy, [id number]- Gait training, [phone]- Traction (mechanical), Patient/Family education, Dry Needling, Joint mobilization, Joint manipulation, and Spinal mobilization.  PLAN FOR NEXT SESSION: Follow up with patient on his self management while away to the beach. Reassess hinging form and exercises.  Discuss how to merge HEP and personal training session.   8:00 AM, 01/14/23 Karna Christmas, SPT  " I agree with the following treatment note after reviewing documentation. This session was performed under the supervision of a licensed clinician."    8:00 AM, 01/14/23 Amy Small Lynch MPT [facility] [place] [phone]

## 2023-01-21 ENCOUNTER — Encounter (HOSPITAL_COMMUNITY): Payer: BLUE CROSS/BLUE SHIELD

## 2023-01-21 ENCOUNTER — Ambulatory Visit (HOSPITAL_COMMUNITY): Payer: Medicare Other

## 2023-01-21 DIAGNOSIS — M545 Low back pain, unspecified: Secondary | ICD-10-CM | POA: Diagnosis not present

## 2023-01-21 DIAGNOSIS — M6281 Muscle weakness (generalized): Secondary | ICD-10-CM

## 2023-01-21 NOTE — Therapy (Addendum)
OUTPATIENT PHYSICAL THERAPY THORACOLUMBAR TREATMENT / DISCHARGE  PHYSICAL THERAPY DISCHARGE SUMMARY  Visits from Start of Care: 12/06/2022  Current functional level related to goals / functional outcomes: See below   Remaining deficits: See below   Education / Equipment: See below   Patient agrees to discharge. Patient goals were met. Patient is being discharged due to meeting the stated rehab goals.   Patient Name: Vincent Mendoza MRN: [id number] DOB:1956/02/16, 67 y.o., male Today's Date: 01/21/2023  END OF SESSION:  PT End of Session - 01/21/23 1050     Visit Number 7    Number of Visits 8    Date for PT Re-Evaluation 01/31/23    Authorization Type Medicare part A & B; BCBS supplement secondary    Authorization Time Period no auth; no limit    Progress Note Due on Visit 8    PT Start Time 1055    PT Stop Time 1140    PT Time Calculation (min) 45 min    Activity Tolerance Patient tolerated treatment well    Behavior During Therapy [facility] for tasks assessed/performed             Past Medical History:  Diagnosis Date   Acromioclavicular joint arthritis 12/13/2020   Diverticulosis    Gout    Hyperlipidemia    Hypertension    stopped taking Lisinopril after a week   Impingement syndrome of right shoulder 12/13/2020   Inguinal hernia    right   Kidney stone    Morton's neuroma of left foot    Myocardial infarction [facility]) 2020   with stent   Sleep apnea    Wears glasses    Past Surgical History:  Procedure Laterality Date   cardiac stent placement     COLONOSCOPY  2018   EXCISION MORTON'S NEUROMA Left 03/20/2017   Procedure: Left 3-4 Morton's Neuroma Excision;  Surgeon: Toni Arthurs, MD;  Location: [facility];  Service: Orthopedics;  Laterality: Left;   INGUINAL HERNIA REPAIR Bilateral 12/29/2014   Procedure: LAPAROSCOPIC BILATERAL INGUINAL HERNIA REPAIR;  Surgeon: Axel Filler, MD;  Location: [facility] OR;  Service: General;  Laterality:  Bilateral;   INSERTION OF MESH Right 12/29/2014   Procedure: INSERTION OF MESH;  Surgeon: Axel Filler, MD;  Location: [facility] OR;  Service: General;  Laterality: Right;   LAPAROSCOPY N/A 12/29/2014   Procedure: LAPAROSCOPY DIAGNOSTIC;  Surgeon: Axel Filler, MD;  Location: [facility] OR;  Service: General;  Laterality: N/A;   LITHOTRIPSY     x3   LIVER BIOPSY N/A 12/29/2014   Procedure: LIVER BIOPSY;  Surgeon: Axel Filler, MD;  Location: [facility];  Service: General;  Laterality: N/A;   SHOULDER ARTHROSCOPY WITH BICEPS TENDON REPAIR  01/02/2021   Procedure: BICEPS TENDON REPAIR;  Surgeon: Salvatore Marvel, MD;  Location: [facility];  Service: Orthopedics;;   SHOULDER ARTHROSCOPY WITH ROTATOR CUFF REPAIR AND SUBACROMIAL DECOMPRESSION Right 01/02/2021   Procedure: SHOULDER ARTHROSCOPY WITH ROTATOR CUFF REPAIR AND SUBACROMIAL DECOMPRESSION, DISTAL CLAVICLE EXCISION;  Surgeon: Salvatore Marvel, MD;  Location:  SURGERY CENTER;  Service: Orthopedics;  Laterality: Right;   TONSILLECTOMY     VASECTOMY     Patient Active Problem List   Diagnosis Date Noted   Other erythrocytosis 10/08/2021   Traumatic rotator cuff tear, right, subsequent encounter 12/13/2020   Impingement syndrome of right shoulder 12/13/2020   Acromioclavicular joint arthritis 12/13/2020   Erythrocytosis 11/18/2018   Non-restorative sleep 11/18/2018   Coronary artery disease involving native coronary artery  of native heart with angina pectoris with documented spasm (HCC) 11/18/2018   Status post angioplasty with stent 11/18/2018   Excessive daytime sleepiness 11/18/2018   Snoring 11/18/2018   Hip pain, acute, unspecified laterality 11/18/2018   Diverticulitis of colon 05/18/2014    PCP: Chilton Greathouse MD  REFERRING PROVIDER: Windle Guard, MD  REFERRING DIAG: M54.50 (ICD-10-CM) - Low back pain   Rationale for Evaluation and Treatment: Rehabilitation  THERAPY DIAG:  Muscle weakness  (generalized)  Lumbar back pain  ONSET DATE: Multiple years  SUBJECTIVE:                                                                                                                                                                                           SUBJECTIVE STATEMENT: Patient is feeling good today. He notices feeling a little sore "like a tired back" but no pain. He was not able to do his exercises while away at the beach, but tolerated daily activity without pain. Worked out at [employer] this morning. Moved his dead lift to earlier in session as discussed. Felt much better with that order and focusing on the new cues.  Goes by "[name]"  Eval:Chronic problem over past few years. Notices standing for long times will increase pain. 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 Pain in coccyx area with prolonged sitting irritates it and feels like he has had fall on bottom but no fall. Getting ready to start on Personal trainer on Monday. 3.5 mile walk this morning. Prolonged rest in one position also increased pain. Side sleeper with at least 6-8 hours of sleep. Reports increased urgency in urination in recent time.  PERTINENT HISTORY:  Morton's Neuroma Stent Diuretics  PAIN:  Are you having pain? Yes: NPRS scale: 2/10 Pain location: low back Pain description: achy Aggravating factors: long term work Relieving factors: resting   PRECAUTIONS: None  RED FLAGS: None   WEIGHT BEARING RESTRICTIONS: No  FALLS:  Has patient fallen in last 6 months? No   OCCUPATION: Retired vertinarian   PLOF: Independent  PATIENT GOALS: control pain  NEXT MD VISIT: no followup yet  OBJECTIVE:  Note: Objective measures were completed at Evaluation unless otherwise noted.  DIAGNOSTIC FINDINGS:  X-ray lumbar spine (10/18/2022): 5 lumbar-type vertebral bodies without significant scoliosis. No high-grade listhesis. Disc space heights  well-maintained. Facet arthrosis at L4-5 and L5-S1. Mildly exaggerated lumbar lordosis. No significant displacement of the coccyx.   PATIENT SURVEYS:  Modified Oswestry 9/50  01/21/2023: 0/50  COGNITION: Overall cognitive status: Within functional limits for tasks assessed     SENSATION:  WFL  MUSCLE LENGTH: Hamstrings: Right 65 deg; Left 55 deg and painful  01/21/23: Right 85 deg; Left 80 deg  POSTURE: rounded shoulders, forward head, increased lumbar lordosis, and anterior pelvic tilt  PALPATION: TTP along lumbar paraspinals and increased tension note through paraspinals  LUMBAR ROM:   AROM eval 01/21/23  Flexion 50% 75%  Extension 75% and painful WFL; pain free  Right lateral flexion 75% and painful WFL: pain free  Left lateral flexion 75% and painful WFL: pain free  Right rotation 75% and painful WFL: pain free  Left rotation 75% and painful WFL: pain free   (Blank rows = not tested)  LOWER EXTREMITY ROM:     Passive  Right eval Left eval  Hip flexion    Hip extension    Hip abduction    Hip adduction    Hip internal rotation 18 15  Hip external rotation [facility]  Knee flexion    Knee extension    Ankle dorsiflexion    Ankle plantarflexion    Ankle inversion    Ankle eversion     (Blank rows = not tested)  LOWER EXTREMITY MMT:    MMT Right eval Right 01/21/23 Left eval Left 01/21/23  Hip flexion 4- 5 4- 4+  Hip extension 3+ 4+ 3+ 4+  Hip abduction 4- 4+ 4- 4+  Hip adduction      Hip internal rotation      Hip external rotation      Knee flexion      Knee extension      Ankle dorsiflexion      Ankle plantarflexion      Ankle inversion      Ankle eversion       (Blank rows = not tested)  LUMBAR SPECIAL TESTS:  Seated extension rotation test Marden Noble test): Positive  GAIT: Distance walked: 59ft Assistive device utilized: None Level of assistance: Complete Independence Comments: mild bilateral trendelenberg with anterior pelvic tilt  TODAY'S  TREATMENT:                                                                                                                              DATE:   01/21/23: Reassessment and discharge   01/14/23 Warm up on recumbent bike 6' Pelvic tilt x 5  Bridge x 10 with 3 sec hold Supine on red physio ball: Knee to chest on x10 Bridge with feet on ball 2x10, 3 sec hold Hamstring curl 2x10  Single leg hamstring curl x 10 each Shoulder extension BTB x 10 Hip Hinge with BTB shoulder extension for RNT cue   01/10/23 Warm up on recumbent bike 6' Pelvic tilt x 5  Bridge x 10 Supine on red physio ball: Knee to chest on x10 Bridge with feet on ball x10 Hamstring curl x10  Single leg hamstring curl x 10 each   01/01/23 Warm up on [employer] bike 5' Abdominal bracing synced with breathing (tight core following exhale)  5x5" hold Abdominal bracing with bridge synced with breathing 10x 3" hold Abdominal bracing with ball squeeze and bridge 10x 3" hold Abdominal bracing with GTB Clamshell and bridge 10x 3" hold Dead Bug with GTB pull apart UE and Leg Ext x10 Bilateral    12/17/22 Supine:  Hamstring stretch 5 x 20" Abdominal bracing 5" hold x 10 Abdominal bracing with bridge x 10 Abdominal bracing with ball squeeze and bridge x 10 Abdominal bracing with Clamshell GTB x 10  Dead Bug with GTB pull apart UE and Leg Ext x10 Bilateral    12/09/2022 Review HEP and goals Seated hamstring stretch 5 x 20" Physioball abdominal bracing 5" hold x 10  Supine: Posterior pelvic tilt 5" hold x 10 Abdominal bracing 5" x 10 Abdominal bracing with bridge x 10  Sidelying  Clam RTB x 10 each   12/06/2022 PT evaluation, findings, frequency, prognosis    PATIENT EDUCATION:  Education details: Findings, prognosis, discussion of relation between back pain and pelvic floor pain. Discussed coccyx pain as well. P Person educated: Patient Education method: Explanation, Demonstration, Tactile cues, Verbal  cues, and Handouts Education comprehension: verbalized understanding and returned demonstration  HOME EXERCISE PROGRAM: Access Code: [email or web address] URL: [email or web address] Date: 01/01/2023 Prepared by: [facility] - Rehab  Exercises - Supine Posterior Pelvic Tilt  - 1 x daily - 7 x weekly - 3 sets - 10 reps - Seated Hamstring Stretch  - 1 x daily - 7 x weekly - 3 sets - 10 reps - Hooklying Clamshell with Resistance  - 2 x daily - 7 x weekly - 1 sets - 10 reps - Supine Transversus Abdominis Bracing - Hands on Stomach  - 1 x daily - 7 x weekly - 1 sets - 10 reps - 5 sec hold - Supine Bridge  - 1 x daily - 7 x weekly - 1 sets - 10 reps - 5 sec hold - Supine Bridge with Mini Swiss Ball Between Knees  - 1 x daily - 7 x weekly - 1 sets - 10 reps - Supine Dead Bug with Leg Extension and Band Brace  - 1 x daily - 7 x weekly - 1 sets - 10 reps - Alternating Single Leg Bridge  - 1 x daily - 7 x weekly - 1 sets - 10 reps  ASSESSMENT:  CLINICAL IMPRESSION: Patient presents to the clinic following a week away at the beach fishing. He has shown much improvement in tolerating daily activity and recreational activities. Patient's strength has improved since eval. His hamstring length has significantly improved and he is no longer having pain with any back movements. Modified Oswestry score improved to 0/50 with no difficulty with daily activity. Now that the patient is doing better, he's been going to the gym to do personal training exercises on his own. Reviewed exercises on a physio ball and made modifications to his exercise program. Patient needed less cues for proper form and technique compare to last session showing good carry over. Patient agreeable to discharge and happy with progress so far.  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 Pt lives very active lifestyle but is limited in standing and prolonged  activities in sitting and standing. Reports coccyx pain with prolonged sitting. Along with x-ray findings and Facet Hypertrophy, pt showing muscle imbalance in similar pattern of lower cross syndrome. Pt demonstrating limitations in functional endurance during ADLs, home activities requiring lifting,  cleaning, and recreational due to low back pain. These limitations are evident by ROM restrictions, muscle weakness, poor postural awareness, and pain. Pt would benefit from skilled Physical Therapy services to address functional deficits and improve overall QOL.  OBJECTIVE IMPAIRMENTS: decreased activity tolerance, decreased mobility, decreased ROM, decreased strength, hypomobility, and pain.   ACTIVITY LIMITATIONS: carrying, lifting, bending, sitting, standing, and sleeping  PARTICIPATION LIMITATIONS: driving, shopping, community activity, occupation, and yard work  PERSONAL FACTORS: Age are also affecting patient's functional outcome.   REHAB POTENTIAL: Excellent  CLINICAL DECISION MAKING: Stable/uncomplicated  EVALUATION COMPLEXITY: Low   GOALS: Goals reviewed with patient? No  SHORT TERM GOALS: Target date: 01/03/2023  Pt will be independent with HEP in order to demonstrate participation in Physical Therapy POC.  Baseline: Goal status: MET  2.  Pt will report 'No pain' while performing functional recreational activities for at least 30 minutes in order to improve endurance capacity  Baseline:  Goal status: MET  LONG TERM GOALS: Target date: 01/31/2023  Pt will improve hip extension strength by > 1/2 grade in order to improve functional strength during recreational activities.  Baseline: see objective  Goal status: MET  2.  Pt will will improve hip abduction strength by > 1/2 grade in order to improve functional strength during recreational activities.  Baseline: see objective  Goal status: MET  3.  Pt will improve Modified Oswestry score by 5 points in order to demonstrate  improved pain with functional goals and outcomes. Baseline: 9/50 01/21/23: 0/50 Goal status: MET  4.  Pt will report 0/10 pain while performing functional recreational activities for > 60 minutes in order to improve endurance capacity  Baseline: see objective  Goal status: MET   5. Pt will improve hamstring extensibility by > 10 degrees in order to improve muscle mobility and improve performance during recreational events Baseline: see objective  Goal Status: MET   PLAN:  PT FREQUENCY: 1x/week  PT DURATION: 8 weeks  PLANNED INTERVENTIONS: 97146- PT Re-evaluation, 97110-Therapeutic exercises, 97530- Therapeutic activity, 97112- Neuromuscular re-education, 97535- Self Care, [id number]- Manual therapy, [id number]- Gait training, [phone]- Traction (mechanical), Patient/Family education, Dry Needling, Joint mobilization, Joint manipulation, and Spinal mobilization.  PLAN FOR NEXT SESSION: Discharged  12:41 PM, 01/21/23 Karna Christmas, SPT  " I agree with the following treatment note after reviewing documentation. This session was performed under the supervision of a licensed clinician."    12:41 PM, 01/21/23 Amy Small Lynch MPT [facility] [place] [phone]

## 2023-01-26 ENCOUNTER — Other Ambulatory Visit (HOSPITAL_COMMUNITY): Payer: Self-pay

## 2023-01-28 ENCOUNTER — Encounter (HOSPITAL_COMMUNITY): Payer: BLUE CROSS/BLUE SHIELD

## 2023-01-29 LAB — NMR, LIPOPROFILE
Cholesterol, Total: 117 mg/dL (ref 100–199)
HDL Particle Number: 32.9 umol/L (ref >=30.5)
HDL-C: 47 mg/dL (ref >39)
LDL Particle Number: 604 nmol/L (ref <1000)
LDL Size: 20.2 nmol — ABNORMAL LOW (ref >20.5)
LDL-C (NIH Calc): 55 mg/dL (ref 0–99)
LP-IR Score: 50 — ABNORMAL HIGH (ref <=45)
Small LDL Particle Number: 418 nmol/L (ref <=527)
Triglycerides: 73 mg/dL (ref 0–149)

## 2023-01-29 LAB — LIPOPROTEIN A (LPA): Lipoprotein (a): 38.2 nmol/L (ref <75.0)

## 2023-02-04 ENCOUNTER — Encounter (HOSPITAL_COMMUNITY): Payer: BLUE CROSS/BLUE SHIELD

## 2023-02-06 ENCOUNTER — Encounter: Payer: Self-pay | Admitting: Internal Medicine

## 2023-02-11 ENCOUNTER — Encounter (HOSPITAL_COMMUNITY): Payer: BLUE CROSS/BLUE SHIELD

## 2023-03-05 ENCOUNTER — Other Ambulatory Visit (HOSPITAL_COMMUNITY): Payer: Self-pay

## 2023-03-05 MED ORDER — WEGOVY 1.7 MG/0.75ML ~~LOC~~ SOAJ
1.7000 mg | SUBCUTANEOUS | 3 refills | Status: DC
  Filled 2023-03-05: qty 9, 84d supply, fill #0
  Filled 2023-03-05: qty 3, 28d supply, fill #0
  Filled 2023-03-31: qty 3, 28d supply, fill #1

## 2023-03-26 ENCOUNTER — Other Ambulatory Visit: Payer: Self-pay | Admitting: Internal Medicine

## 2023-03-28 ENCOUNTER — Other Ambulatory Visit: Payer: Self-pay | Admitting: Internal Medicine

## 2023-03-28 NOTE — Telephone Encounter (Signed)
Chart said he was on tadalafil at some point - but we can fill sildenafil -  he cannot take both. I took the other medicine off his list.

## 2023-03-31 ENCOUNTER — Other Ambulatory Visit (HOSPITAL_COMMUNITY): Payer: Self-pay

## 2023-03-31 ENCOUNTER — Other Ambulatory Visit: Payer: Self-pay

## 2023-04-01 ENCOUNTER — Other Ambulatory Visit (HOSPITAL_COMMUNITY): Payer: Self-pay

## 2023-04-01 MED ORDER — WEGOVY 1.7 MG/0.75ML ~~LOC~~ SOAJ
1.7000 mg | SUBCUTANEOUS | 3 refills | Status: AC
  Filled 2023-04-01: qty 9, 84d supply, fill #0
  Filled 2023-04-03: qty 3, 28d supply, fill #0
  Filled 2023-05-05: qty 3, 28d supply, fill #1
  Filled 2024-03-30: qty 3, 28d supply, fill #2

## 2023-04-03 ENCOUNTER — Other Ambulatory Visit (HOSPITAL_COMMUNITY): Payer: Self-pay

## 2023-05-05 ENCOUNTER — Other Ambulatory Visit (HOSPITAL_COMMUNITY): Payer: Self-pay

## 2023-06-03 ENCOUNTER — Other Ambulatory Visit (HOSPITAL_COMMUNITY): Payer: Self-pay

## 2023-06-03 MED ORDER — WEGOVY 2.4 MG/0.75ML ~~LOC~~ SOAJ
2.4000 mg | SUBCUTANEOUS | 5 refills | Status: DC
  Filled 2023-06-03: qty 3, 28d supply, fill #0
  Filled 2023-07-04: qty 3, 28d supply, fill #1
  Filled 2023-07-28: qty 3, 28d supply, fill #2
  Filled 2023-08-26: qty 3, 28d supply, fill #3
  Filled 2023-09-25: qty 3, 28d supply, fill #4
  Filled 2023-10-18: qty 3, 28d supply, fill #5

## 2023-06-30 ENCOUNTER — Other Ambulatory Visit: Payer: Self-pay | Admitting: Internal Medicine

## 2023-07-09 ENCOUNTER — Other Ambulatory Visit (HOSPITAL_COMMUNITY): Payer: Self-pay

## 2023-07-28 ENCOUNTER — Other Ambulatory Visit (HOSPITAL_COMMUNITY): Payer: Self-pay

## 2023-11-27 ENCOUNTER — Other Ambulatory Visit (HOSPITAL_COMMUNITY): Payer: Self-pay

## 2023-11-27 MED ORDER — WEGOVY 2.4 MG/0.75ML ~~LOC~~ SOAJ
2.4000 mg | SUBCUTANEOUS | 5 refills | Status: AC
  Filled 2023-11-27: qty 3, 28d supply, fill #0
  Filled 2023-12-25: qty 3, 28d supply, fill #1
  Filled 2024-01-28: qty 3, 28d supply, fill #2
  Filled 2024-02-23: qty 3, 28d supply, fill #3
  Filled ????-??-??: fill #4

## 2023-12-25 ENCOUNTER — Other Ambulatory Visit: Payer: Self-pay

## 2023-12-25 ENCOUNTER — Other Ambulatory Visit (HOSPITAL_COMMUNITY): Payer: Self-pay

## 2024-03-30 ENCOUNTER — Encounter (HOSPITAL_COMMUNITY): Payer: Self-pay

## 2024-03-30 ENCOUNTER — Other Ambulatory Visit (HOSPITAL_COMMUNITY): Payer: Self-pay

## 2024-03-30 MED ORDER — WEGOVY 1.7 MG/0.75ML ~~LOC~~ SOAJ: 1.7000 mg | SUBCUTANEOUS | 3 refills | Status: AC

## 2024-03-31 ENCOUNTER — Other Ambulatory Visit (HOSPITAL_COMMUNITY): Payer: Self-pay
